# Patient Record
Sex: Female | Born: 1940 | Race: White | Hispanic: No | Marital: Married | Smoking: Never smoker
Health system: Southern US, Community
[De-identification: ages and names within clinical notes are randomized; demographics above are authoritative.]

## PROBLEM LIST (undated history)

## (undated) DIAGNOSIS — E785 Hyperlipidemia, unspecified: Secondary | ICD-10-CM

## (undated) DIAGNOSIS — M1990 Unspecified osteoarthritis, unspecified site: Secondary | ICD-10-CM

## (undated) DIAGNOSIS — E669 Obesity, unspecified: Secondary | ICD-10-CM

## (undated) DIAGNOSIS — Z955 Presence of coronary angioplasty implant and graft: Secondary | ICD-10-CM

## (undated) DIAGNOSIS — R05 Cough: Secondary | ICD-10-CM

## (undated) DIAGNOSIS — I2510 Atherosclerotic heart disease of native coronary artery without angina pectoris: Secondary | ICD-10-CM

## (undated) DIAGNOSIS — R053 Chronic cough: Secondary | ICD-10-CM

## (undated) DIAGNOSIS — I499 Cardiac arrhythmia, unspecified: Secondary | ICD-10-CM

## (undated) DIAGNOSIS — K219 Gastro-esophageal reflux disease without esophagitis: Secondary | ICD-10-CM

## (undated) DIAGNOSIS — I10 Essential (primary) hypertension: Secondary | ICD-10-CM

## (undated) HISTORY — DX: Hyperlipidemia, unspecified: E78.5

## (undated) HISTORY — DX: Unspecified osteoarthritis, unspecified site: M19.90

## (undated) HISTORY — PX: CHOLECYSTECTOMY: SHX55

## (undated) HISTORY — PX: APPENDECTOMY: SHX54

## (undated) HISTORY — DX: Obesity, unspecified: E66.9

## (undated) HISTORY — PX: BREAST ENHANCEMENT SURGERY: SHX7

## (undated) HISTORY — DX: Essential (primary) hypertension: I10

## (undated) HISTORY — DX: Atherosclerotic heart disease of native coronary artery without angina pectoris: I25.10

## (undated) HISTORY — DX: Cough: R05

## (undated) HISTORY — DX: Presence of coronary angioplasty implant and graft: Z95.5

## (undated) HISTORY — DX: Chronic cough: R05.3

## (undated) HISTORY — DX: Gastro-esophageal reflux disease without esophagitis: K21.9

## (undated) HISTORY — PX: ANGIOPLASTY: SHX39

## (undated) HISTORY — PX: TONSILLECTOMY: SUR1361

## (undated) HISTORY — PX: OTHER SURGICAL HISTORY: SHX169

---

## 1971-01-25 HISTORY — PX: VAGINAL HYSTERECTOMY: SUR661

## 1998-05-21 ENCOUNTER — Other Ambulatory Visit: Admission: RE | Admit: 1998-05-21 | Discharge: 1998-05-21 | Payer: Self-pay | Admitting: Obstetrics and Gynecology

## 1999-06-02 ENCOUNTER — Other Ambulatory Visit: Admission: RE | Admit: 1999-06-02 | Discharge: 1999-06-02 | Payer: Self-pay | Admitting: Obstetrics and Gynecology

## 2000-06-01 ENCOUNTER — Other Ambulatory Visit: Admission: RE | Admit: 2000-06-01 | Discharge: 2000-06-01 | Payer: Self-pay | Admitting: Obstetrics and Gynecology

## 2001-06-05 ENCOUNTER — Other Ambulatory Visit: Admission: RE | Admit: 2001-06-05 | Discharge: 2001-06-05 | Payer: Self-pay | Admitting: Obstetrics and Gynecology

## 2002-06-07 ENCOUNTER — Other Ambulatory Visit: Admission: RE | Admit: 2002-06-07 | Discharge: 2002-06-07 | Payer: Self-pay | Admitting: Obstetrics and Gynecology

## 2003-06-09 ENCOUNTER — Other Ambulatory Visit: Admission: RE | Admit: 2003-06-09 | Discharge: 2003-06-09 | Payer: Self-pay | Admitting: Obstetrics and Gynecology

## 2003-11-26 ENCOUNTER — Encounter: Admission: RE | Admit: 2003-11-26 | Discharge: 2003-11-26 | Payer: Self-pay | Admitting: Family Medicine

## 2004-05-04 ENCOUNTER — Encounter: Admission: RE | Admit: 2004-05-04 | Discharge: 2004-05-04 | Payer: Self-pay | Admitting: Neurology

## 2004-06-16 ENCOUNTER — Other Ambulatory Visit: Admission: RE | Admit: 2004-06-16 | Discharge: 2004-06-16 | Payer: Self-pay | Admitting: Obstetrics and Gynecology

## 2004-06-22 ENCOUNTER — Ambulatory Visit: Payer: Self-pay | Admitting: Internal Medicine

## 2004-07-22 ENCOUNTER — Ambulatory Visit: Payer: Self-pay | Admitting: Internal Medicine

## 2004-07-23 ENCOUNTER — Ambulatory Visit: Payer: Self-pay | Admitting: Cardiology

## 2004-08-19 ENCOUNTER — Ambulatory Visit: Payer: Self-pay | Admitting: Internal Medicine

## 2004-09-30 ENCOUNTER — Ambulatory Visit: Payer: Self-pay | Admitting: Internal Medicine

## 2005-06-17 ENCOUNTER — Other Ambulatory Visit: Admission: RE | Admit: 2005-06-17 | Discharge: 2005-06-17 | Payer: Self-pay | Admitting: Obstetrics and Gynecology

## 2006-06-21 ENCOUNTER — Other Ambulatory Visit: Admission: RE | Admit: 2006-06-21 | Discharge: 2006-06-21 | Payer: Self-pay | Admitting: Obstetrics and Gynecology

## 2006-07-04 ENCOUNTER — Inpatient Hospital Stay (HOSPITAL_BASED_OUTPATIENT_CLINIC_OR_DEPARTMENT_OTHER): Admission: RE | Admit: 2006-07-04 | Discharge: 2006-07-04 | Payer: Self-pay | Admitting: Cardiology

## 2006-07-17 ENCOUNTER — Inpatient Hospital Stay (HOSPITAL_COMMUNITY): Admission: AD | Admit: 2006-07-17 | Discharge: 2006-07-18 | Payer: Self-pay | Admitting: Cardiology

## 2006-07-17 HISTORY — PX: CARDIAC CATHETERIZATION: SHX172

## 2006-08-31 ENCOUNTER — Encounter (HOSPITAL_COMMUNITY): Admission: RE | Admit: 2006-08-31 | Discharge: 2006-11-29 | Payer: Self-pay | Admitting: Cardiology

## 2008-07-08 ENCOUNTER — Encounter: Payer: Self-pay | Admitting: Obstetrics and Gynecology

## 2008-07-08 ENCOUNTER — Ambulatory Visit: Payer: Self-pay | Admitting: Obstetrics and Gynecology

## 2008-07-08 ENCOUNTER — Other Ambulatory Visit: Admission: RE | Admit: 2008-07-08 | Discharge: 2008-07-08 | Payer: Self-pay | Admitting: Obstetrics and Gynecology

## 2008-07-15 ENCOUNTER — Ambulatory Visit: Payer: Self-pay | Admitting: Obstetrics and Gynecology

## 2008-10-29 ENCOUNTER — Encounter: Admission: RE | Admit: 2008-10-29 | Discharge: 2008-10-29 | Payer: Self-pay | Admitting: Orthopedic Surgery

## 2009-03-31 HISTORY — PX: CARDIOVASCULAR STRESS TEST: SHX262

## 2009-07-09 ENCOUNTER — Ambulatory Visit: Payer: Self-pay | Admitting: Obstetrics and Gynecology

## 2009-10-01 ENCOUNTER — Ambulatory Visit: Payer: Self-pay | Admitting: Cardiology

## 2009-10-06 ENCOUNTER — Encounter: Payer: Self-pay | Admitting: Cardiology

## 2010-02-16 ENCOUNTER — Encounter (INDEPENDENT_AMBULATORY_CARE_PROVIDER_SITE_OTHER): Payer: Self-pay | Admitting: *Deleted

## 2010-02-25 NOTE — Letter (Signed)
Summary: New Patient letter  [facility]  [address] [place], [place] [address]   Phone: [phone]  Fax: [phone]       02/16/2010 MRN: [id number]  [facility] [address] [place], [place]  [address]  Dear Ms. Heemstra,  Welcome to the Gastroenterology Division at [facility].    You are scheduled to see Dr.  Yancey Flemings on 03-24-2010 at 9:30am on the 3rd floor at [facility], [address]  We ask that you try to arrive at our office 15 minutes prior to your appointment time to allow for check-in.  We would like you to complete the enclosed self-administered evaluation form prior to your visit and bring it with you on the day of your appointment.  We will review it with you.  Also, please bring a complete list of all your medications or, if you prefer, bring the medication bottles and we will list them.  Please bring your insurance card so that we may make a copy of it.  If your insurance requires a referral to see a specialist, please bring your referral form from your primary care physician.  Co-payments are due at the time of your visit and may be paid by cash, check or credit card.     Your office visit will consist of a consult with your physician (includes a physical exam), any laboratory testing he/she may order, scheduling of any necessary diagnostic testing (e.g. x-ray, ultrasound, CT-scan), and scheduling of a procedure (e.g. Endoscopy, Colonoscopy) if required.  Please allow enough time on your schedule to allow for any/all of these possibilities.    If you cannot keep your appointment, please call [phone] to cancel or reschedule prior to your appointment date.  This allows [place] the opportunity to schedule an appointment for another patient in need of care.  If you do not cancel or reschedule by 5 p.m. the business day prior to your appointment date, you will be charged a $50.00 late cancellation/no-show fee.    Thank you  for choosing [facility] for your medical needs.  We appreciate the opportunity to care for you.  Please visit [place] at our website  to learn more about our practice.                     Sincerely,                                                             The Gastroenterology Division

## 2010-03-24 ENCOUNTER — Encounter: Payer: Self-pay | Admitting: Internal Medicine

## 2010-03-24 ENCOUNTER — Ambulatory Visit (INDEPENDENT_AMBULATORY_CARE_PROVIDER_SITE_OTHER): Payer: Medicare Other | Admitting: Internal Medicine

## 2010-03-24 DIAGNOSIS — Z1211 Encounter for screening for malignant neoplasm of colon: Secondary | ICD-10-CM

## 2010-03-24 DIAGNOSIS — E119 Type 2 diabetes mellitus without complications: Secondary | ICD-10-CM

## 2010-03-24 DIAGNOSIS — I2510 Atherosclerotic heart disease of native coronary artery without angina pectoris: Secondary | ICD-10-CM | POA: Insufficient documentation

## 2010-03-24 DIAGNOSIS — K219 Gastro-esophageal reflux disease without esophagitis: Secondary | ICD-10-CM | POA: Insufficient documentation

## 2010-04-01 NOTE — Letter (Signed)
Summary: [facility] Instructions  [facility]  [address] [place], [place] [address]   Phone: [phone]  Fax: [phone]       [name]    [birth year]    MRN: [id number]        Procedure Day /Date:TUESDAY 04/27/10     Arrival Time:9:00 AM     Procedure Time:10:00 AM     Location of Procedure:                    X  [facility] (4th Floor)         PREPARATION FOR COLONOSCOPY WITH MOVIPREP   Starting 5 days prior to your procedure 04/22/10 do not eat nuts, seeds, popcorn, corn, beans, peas,  salads, or any raw vegetables.  Do not take any fiber supplements (e.g. Metamucil, Citrucel, and Benefiber).  THE DAY BEFORE YOUR PROCEDURE         DATE: 04/26/10  DAY: MONDAY  1.  Drink clear liquids the entire day-NO SOLID FOOD  2.  Do not drink anything colored red or purple.  Avoid juices with pulp.  No orange juice.  3.  Drink at least 64 oz. (8 glasses) of fluid/clear liquids during the day to prevent dehydration and help the prep work efficiently.  CLEAR LIQUIDS INCLUDE: Water Jello Ice Popsicles Tea (sugar ok, no milk/cream) Powdered fruit flavored drinks Coffee (sugar ok, no milk/cream) Gatorade Juice: apple, white grape, white cranberry  Lemonade Clear bullion, consomm, broth Carbonated beverages (any kind) Strained chicken noodle soup Hard Candy                             4.  In the morning, mix first dose of MoviPrep solution:    Empty 1 Pouch A and 1 Pouch B into the disposable container    Add lukewarm drinking water to the top line of the container. Mix to dissolve    Refrigerate (mixed solution should be used within 24 hrs)  5.  Begin drinking the prep at 5:00 p.m. The MoviPrep container is divided by 4 marks.   Every 15 minutes drink the solution down to the next mark (approximately 8 oz) until the full liter is complete.   6.  Follow completed prep with 16 oz of clear liquid of your choice (Nothing red or purple).   Continue to drink clear liquids until bedtime.  7.  Before going to bed, mix second dose of MoviPrep solution:    Empty 1 Pouch A and 1 Pouch B into the disposable container    Add lukewarm drinking water to the top line of the container. Mix to dissolve    Refrigerate  THE DAY OF YOUR PROCEDURE      DATE: 04/27/10 DAY: TUESDAY  Beginning at 5:00 A.m. (5 hours before procedure):         1. Every 15 minutes, drink the solution down to the next mark (approx 8 oz) until the full liter is complete.  2. Follow completed prep with 16 oz. of clear liquid of your choice.    3. You may drink clear liquids until 8:00 AM (2 HOURS BEFORE PROCEDURE).   MEDICATION INSTRUCTIONS  Unless otherwise instructed, you should take regular prescription medications with a small sip of water   as early as possible the morning of your procedure.  Diabetic patients - see separate instructions.  Stop taking Plavix 04/22/10 (5 days before procedure).  A LETTER WILL BE SENT TO DR. Swaziland FOR OK TO HOLD PLAVIX         OTHER INSTRUCTIONS  You will need a responsible adult at least 70 years of age to accompany you and drive you home.   This person must remain in the waiting room during your procedure.  Wear loose fitting clothing that is easily removed.  Leave jewelry and other valuables at home.  However, you may wish to bring a book to read or  an iPod/MP3 player to listen to music as you wait for your procedure to start.  Remove all body piercing jewelry and leave at home.  Total time from sign-in until discharge is approximately 2-3 hours.  You should go home directly after your procedure and rest.  You can resume normal activities the  day after your procedure.  The day of your procedure you should not:   Drive   Make legal decisions   Operate machinery   Drink alcohol   Return to work  You will receive specific instructions about eating, activities and medications before you  leave.    The above instructions have been reviewed and explained to me by   _______________________    I fully understand and can verbalize these instructions _____________________________ Date _________

## 2010-04-01 NOTE — Letter (Signed)
Summary: Anticoagulation Modification Letter  [facility]  [address] [place], [place] [address]   Phone: [phone]  Fax: [phone]    March 24, 2010  Re:    [name] DOB:    [birth year] MRN:    [id number]    Dear Dr. Swaziland:  We have scheduled the above patient for a Colonoscopy procedure. Our records show that she is on anticoagulation therapy. Please advise as to how long the patient may come off their therapy of Plavix prior to the scheduled procedure(s) on 04/27/10.   Please fax back/or route the completed form to Milford Cage, CMA at [phone].  Thank you for your help with this matter.  Sincerely,    Wilhemina Bonito. Marina Goodell, MD  Physician Recommendation:  Hold Plavix 5 days prior ________________

## 2010-04-01 NOTE — Assessment & Plan Note (Signed)
Summary:  Consult - colonoscopy ( on Plavix, insulin)  and GERD   History of Present Illness Visit Type: Initial Visit Primary GI MD: Yancey Flemings MD Primary Provider: Sarita Haver Requesting Provider: n/a Chief Complaint: Consult for colonoscopy, pt on Plavix, Pt has never had a colonoscopy PCP recommended she get  one History of Present Illness:    70 year old female with hypertension, hyperlipidemia, coronary artery disease with prior coronary artery drug-eluting stent placement in 2007, osteoarthritis, and GERD. She is sent today regarding routine screening colonoscopy. She has not had this previously, but appears to have canceled a scheduled procedure appointment in 2005. In any event, GI review of systems is negative except for acid reflux. No lower abdominal complaints, change in bowel habits, or bleeding. Review of outside laboratories from December 2011 reveal normal CBC, comprehensive metabolic panel, and lipids. Hemoglobin A1c 7.0. No family history of colon cancer. In terms of GERD, she takes Prilosec 20 mg daily. Without medication, significant symptoms. With medication, good control of symptoms. No dysphagia. For her diabetes she takes both insulin and metformin. She is on aspirin Plavix. She sees Dr. Swaziland for her cardiac care. She tells me that she has been stable in this regard   GI Review of Systems    Reports acid reflux.      Denies abdominal pain, belching, bloating, chest pain, dysphagia with liquids, dysphagia with solids, heartburn, loss of appetite, nausea, vomiting, vomiting blood, weight loss, and  weight gain.        Denies anal fissure, black tarry stools, change in bowel habit, constipation, diarrhea, diverticulosis, fecal incontinence, heme positive stool, hemorrhoids, irritable bowel syndrome, jaundice, light color stool, liver problems, rectal bleeding, and  rectal pain. Preventive Screening-Counseling & Management  Alcohol-Tobacco     Smoking Status:  never      Drug Use:  no.      Current Medications (verified): 1)  Aspirin 325 Mg Tabs (Aspirin) .Marland Kitchen.. 1 By Mouth Once Daily 2)  Metformin Hcl 500 Mg Tabs (Metformin Hcl) .Mar[place] Kitchen.. 1 By Mouth Two Times A Day 3)  Lantus Solostar 100 Unit/ml Soln (Insulin Glargine) .... 60 Units Two Times A Day and Increase As Directed 4)  Ramipril 5 Mg Caps (Ramipril) .Mar[place] Kitchen.. 1 By Mouth Once Daily 5)  Plavix 75 Mg Tabs (Clopidogrel Bisulfate) .Mar[place] Kitchen.. 1 By Mouth Once Daily 6)  Bystolic 20 Mg Tabs (Nebivolol Hcl) .Mar[place] Kitchen.. 1 By Mouth Once Daily 7)  Crestor 10 Mg Tabs (Rosuvastatin Calcium) .Mar[place] Kitchen.. 1 By Mouth Once Daily 8)  Prilosec Otc 20 Mg Tbec (Omeprazole Magnesium) .Marland Kitchen.. 1 By Mouth Once Daily 9)  Climara 0.075 Mg/24hr Ptwk (Estradiol) .Mar[place] Kitchen.. 1 Patch Weekly 10)  Amoxicillin 500 Mg Caps (Amoxicillin) .Mar[place] Kitchen.. 1 By Mouth Three Times A Day For 7 Days Last Day Will Be Sunday 11)  Mucinex Maximum Strength 1200 Mg Xr12h-Tab (Guaifenesin) .Mar[place] Kitchen.. 1 By Mouth Two Times A Day 12)  Delsym 30 Mg/63ml Lqcr (Dextromethorphan Polistirex) .Marland Kitchen.. 10ml Every 12 Hours  Allergies (verified): 1)  ! Erythromycin  Past History:  Past Medical History: HTN GERD Hyperlipidemia Chronic Cough Depression OA Diabetes CAD  Past Surgical History: Appendectomy Cholecystectomy Angioplasty/Stent 2007  Family History: Reviewed history from 03/23/2010 and no changes required. 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  Daily Caffeine Use 2 per day Illicit Drug Use - no Smoking Status:  never Drug Use:  no  Review of Systems       The patient complains of back pain, cough, headaches-new, muscle pains/cramps, and night sweats.  The patient denies allergy/sinus, anemia, anxiety-new, arthritis/joint pain, blood in urine, breast changes/lumps, change in vision, confusion, coughing up  blood, depression-new, fainting, fatigue, fever, hearing problems, heart murmur, heart rhythm changes, itching, menstrual pain, nosebleeds, pregnancy symptoms, shortness of breath, skin rash, sleeping problems, sore throat, swelling of feet/legs, swollen lymph glands, thirst - excessive , urination - excessive , urination changes/pain, urine leakage, vision changes, and voice change.    Physical Exam  General:  Well developed, well nourished, no acute distress. Head:  Normocephalic and atraumatic. Eyes:  PERRLA, no icterus. Mouth:  No deformity or lesions, dentition normal. Neck:  Supple; no masses or thyromegaly. Lungs:  Clear throughout to auscultation. Heart:  Regular rate and rhythm; no murmurs, rubs,  or bruits. Abdomen:  Soft, nontender and nondistended. No masses, hepatosplenomegaly or hernias noted. Normal bowel sounds. Rectal:   deferred until colonoscopy Msk:  Symmetrical with no gross deformities. Normal posture. Pulses:  Normal pulses noted. Extremities:  No clubbing, cyanosis, edema or deformities noted. Neurologic:  Alert and  oriented x4;  grossly normal neurologically. Skin:  Intact without significant lesions or rashes. Psych:  Alert and cooperative. Normal mood and affect.   Impression & Recommendations:  Problem # 1:  SPECIAL SCREENING FOR MALIGNANT NEOPLASMS COLON (ICD-V76.51)  the patient is an appropriate candidate for screening colonoscopy without contraindication. However, she is high-risk given her comorbidities, the need to address Plavix therapy, and the need to adjust her diabetic medications. I discussed with her the nature of colonoscopy as well as its risks, benefits, and alternatives. She understood and agreed to proceed. We also discussed the issues surrounding Plavix therapy.   Plan : #1. Colonoscopy #2. Movi prep prescribed. The patient instructed on its use  #3. hold Plavix 5 days prior to the procedure , but continue on aspirin, if okay with Dr.  Swaziland. A letter has been sent to Dr. Swaziland for his review in this regard.   #4. One half evening  prior insulin and hold morning insulin and metformin the day of the procedure. As well, we will monitor her blood sugar immediately before and after the procedure..  Problem # 2:  CAD (ICD-414.00)  stable. Will address the Plavix issue with Dr. Swaziland. See above  Problem # 3:  DM (ICD-250.00)  adjustment of diabetic medications for the procedure. See above  Problem # 4:  GERD (ICD-530.81)  chronic GERD without alarm symptoms. However, requiring PPI therapy for control.  Plan : #1. Continue least dose of PPI therapy to control symptoms. #2. Reflux precautions  Other Orders: Colonoscopy (Colon)  Patient Instructions: 1)  Colon [facility] 04/27/10 10:00 am arrive at 9:00 am on 4th floor 2)  Movi prep instructions given. 3)  Movi prep prescription sent to your pharmacy,. 4)  Colonoscopy and Flexible Sigmoidoscopy brochure given.  5)  Hold Plavix x 5 days.  A letter will be sent to Dr. Swaziland for approval.   6)  1/2 Lantus evening before procedure.  Hold Lantus and oral diabetic meds morning of procedure. 7)  Copy sent to : Richard Tisovec,MD   Peter Swaziland, MD 8)  The medication list was reviewed and reconciled.  All changed / newly prescribed medications were explained.  A complete medication list was provided to the patient / caregiver. Prescriptions: MOVIPREP 100 GM  SOLR (PEG-KCL-NACL-NASULF-NA ASC-C) As per prep instructions.  #  1 x 0   Entered by:   Milford Cage NCMA   Authorized by:   Hilarie Fredrickson MD   Signed by:   Kathlee Barnhardt NCMA on 03/24/2010   Method used:   Electronically to        [id number] (retail)       [address]       [place], [place]  [address]       Ph: [phone] or [phone]       Fax: [phone]   RxID:   [id number]

## 2010-04-01 NOTE — Letter (Signed)
Summary: Diabetic Instructions  [facility]  [address] [place], [place] [address]   Phone: [phone]  Fax: [phone]    [name] 31-Aug-1940 MRN: [id number]   X   ORAL DIABETIC MEDICATION INSTRUCTIONS  The day before your procedure:   Take your diabetic pill as you do normally  The day of your procedure:   Do not take your diabetic pill    We will check your blood sugar levels during the admission process and again in Recovery before discharging you home  ________________________________________________________________________  X   INSULIN (LONG ACTING) MEDICATION INSTRUCTIONS (Lantus, NPH, 70/30, Humulin, Novolin-N)   The day before your procedure:   Take  your regular evening dose    The day of your procedure:   Do not take your morning dose

## 2010-04-27 ENCOUNTER — Other Ambulatory Visit: Payer: Medicare Other | Admitting: Internal Medicine

## 2010-05-03 ENCOUNTER — Telehealth: Payer: Self-pay | Admitting: Cardiology

## 2010-05-03 NOTE — Telephone Encounter (Signed)
FAX [phone] IS GOING TO HAVE EYE SURGERY AND THE OFFICE NEEDS CLEARANCE. SCHEDULED FOR 05/11/10

## 2010-05-03 NOTE — Telephone Encounter (Signed)
Received call from Dr. Ardyth Harps needing cardiac cl for eye surgery 4/17; advised Dr. Swaziland is on vac. Will be back in office 4/16

## 2010-05-05 ENCOUNTER — Encounter: Payer: Self-pay | Admitting: Internal Medicine

## 2010-05-06 ENCOUNTER — Encounter: Payer: Self-pay | Admitting: Internal Medicine

## 2010-05-06 ENCOUNTER — Ambulatory Visit (AMBULATORY_SURGERY_CENTER): Payer: Medicare Other | Admitting: Internal Medicine

## 2010-05-06 DIAGNOSIS — D126 Benign neoplasm of colon, unspecified: Secondary | ICD-10-CM

## 2010-05-06 DIAGNOSIS — Z1211 Encounter for screening for malignant neoplasm of colon: Secondary | ICD-10-CM

## 2010-05-06 DIAGNOSIS — K5730 Diverticulosis of large intestine without perforation or abscess without bleeding: Secondary | ICD-10-CM

## 2010-05-06 LAB — GLUCOSE, CAPILLARY
Glucose-Capillary: 126 mg/dL — ABNORMAL HIGH (ref 70–99)
Glucose-Capillary: 109 mg/dL — ABNORMAL HIGH (ref 70–99)

## 2010-05-06 MED ORDER — SODIUM CHLORIDE 0.9 % IV SOLN: 500.0000 mL | INTRAVENOUS | Status: AC

## 2010-05-06 NOTE — Patient Instructions (Addendum)
Patient given handout education for polyps, diverticulosis,  High fiber diet and hemorrhoids.  Per Dr. Marina Goodell to restart your plavix today.  To call with any questions or concerns.

## 2010-05-07 ENCOUNTER — Telehealth: Payer: Self-pay | Admitting: Cardiology

## 2010-05-07 ENCOUNTER — Telehealth: Payer: Self-pay | Admitting: *Deleted

## 2010-05-07 NOTE — Telephone Encounter (Signed)
Fax: [phone]  Last office vist, Stress test, EKG, Cath

## 2010-05-07 NOTE — Telephone Encounter (Signed)
Phone number is for an eye Doctor's office, and they don't open until 8:30am.   Patient does not list a home phone. Unable to leave a message.

## 2010-06-08 NOTE — Discharge Summary (Signed)
NAMEJOLIE, [name] NO.:  [id number]   MEDICAL RECORD NO.:  [id number]          PATIENT TYPE:  INP   LOCATION:  6524                         FACILITY:  [facility]   PHYSICIAN:  Peter M. Swaziland, M.D.  DATE OF BIRTH:  1940-10-24   DATE OF ADMISSION:  07/17/2006  DATE OF DISCHARGE:  07/18/2006                               DISCHARGE SUMMARY   HISTORY OF PRESENT ILLNESS:  [name] is a 70 year old white female  who presented with symptoms of atypical chest pain and dyspnea.  She had  newly-diagnosed diabetes mellitus type 2 and severe dyslipidemia.  She  subsequently underwent stress Cardiolite study which showed evidence of  anterior apical ischemia.  Cardiac catheterization demonstrated a  moderate stenosis of the proximal LAD, approximately 60-70%.  The  patient was admitted for stenting of this lesion.   For details of her past medical history, social history, family history  and physical exam, please see admission history and physical.   HOSPITAL COURSE:  The patient was brought to cardiac catheterization  laboratory.  She was loaded with Angiomax.  We initially performed an  intravascular ultrasound of the proximal LAD.  This demonstrated severe  stenosis in the region of interest, with a fairly focal lesion of  approximately 11 mm.  We then proceeded with stenting of this lesion  using a 3.0 x 16-mm Taxus stent that was postdilated to 3.25 mm based on  her IVUS reference diameter.  This yielded an excellent angiographic  result, with no significant residual stenosis, both by catheterization  and by intravascular ultrasound.  Her groin was sealed with an Angio-  Seal device.  She was monitored overnight.  She had no complications.  She had no groin hematoma.  Her CBC and BMET were stable.  Her ECG was  normal.  She was discharged home the following day in stable condition.   DISCHARGE DIAGNOSES:  1. Coronary artery disease status post stenting of the  proximal left      anterior descending.  2. Diabetes mellitus, type 2, insulin-requiring.  3. Hyperlipidemia.   DISCHARGE MEDICATIONS:  1. Enteric-coated aspirin 325 mg per day.  2. Plavix 75 mg per day.  3. Crestor 10 mg per day.  4. Altace 5 mg daily.  5. Wellbutrin XL 300 mg per day.  6. Metformin 500 mg twice a day to be resumed on Thursday, July 20, 2006.  7. Lantus insulin 20 units at bedtime.  8. Nitroglycerin 0.4 mg sublingual p.r.n.   FOLLOW UP:  The patient will follow up with Dr. Swaziland in mid-July.   CONDITION ON DISCHARGE:  Discharge status is improved.           ______________________________  Peter M. Swaziland, M.D.     PMJ/MEDQ  D:  07/18/2006  T:  07/18/2006  Job:  [id number]   cc:   Gaspar Garbe, M.D.

## 2010-06-08 NOTE — Cardiovascular Report (Signed)
NAMEBONNE, WHACK NO.:  [id number]   MEDICAL RECORD NO.:  [id number]          PATIENT TYPE:  INP   LOCATION:  6524                         FACILITY:  [facility]   PHYSICIAN:  Peter M. Swaziland, M.D.  DATE OF BIRTH:  1940-07-08   DATE OF PROCEDURE:  07/17/2006  DATE OF DISCHARGE:                            CARDIAC CATHETERIZATION   INDICATIONS FOR PROCEDURE:  The patient is a 70 year old white female  with newly diagnosed hyperlipidemia and diabetes mellitus.  She had  atypical chest pain.  A stress Cardiolite study demonstrated anterior  apical ischemic abnormality.  Subsequent diagnostic cardiac  catheterization demonstrated a 50-70% stenosis of the proximal LAD that  was eccentric.  There was a small caliber intermediate branch that was  subtotally occluded.  No other significant disease was noted.   PROCEDURE:  Intracoronary stenting of the proximal LAD with  intravascular ultrasound guidance.   ACCESS:  Via right femoral artery using the standard Seldinger  technique.   EQUIPMENT USED:  A 6-French arterial sheath, a 6-French FL-4 guide, a  0.014 Asahi soft wire, and Atlantis SR Pro IVUS catheter, a 3 x 12-mm  Maverick balloon, a 3 x 16-mm TAXUS stent, and a 3.25 x 12-mm Quantum  Maverick balloon.   CONTRAST:  Omnipaque 165 ml.   MEDICATIONS:  1. Versed 2 mg IV.  2. Nitroglycerin intracoronary 200 mcg x1.  3. Angiomax bolus 0.75 mg/kg, followed by continuous fusion at 1.75      mg/kg per hour.  4. Fentanyl 25 mcg IV.   The patient was hemodynamically stable throughout procedure and remained  pain free.  At the end of the procedure, her right groin was sealed with  an Angio-Seal device with excellent hemostasis.   PROCEDURE NOTE:  After initial guide shots were obtained, we proceeded  to anticoagulate the patient with Angiomax.  Next, we placed a wire down  the LAD.  This proved to be very difficult given the fact that the  patient's LAD was acutely  angulated from the left main coronary artery.  Also, the wire tended to select the intermediate branch.  After putting  in an exaggerated loop on the tip of the wire, we were finally able to  negotiate this using a Asahi soft wire.  The rest the procedure went  very smoothly.  Initially, we performed an intravascular ultrasound of the LAD, since  the lesion by angiogram did not appear to be that severe.  However, on  intravascular ultrasound there was indeed a focal severe stenosis in the  proximal LAD.  The distal reference lumen diameter was 3.1, with a  proximal reference lumen diameter of 3.3-mm.  The length of the lesion  was approximately 11-mm.  We initially pre-dilated the lesion using a 3  x 12-mm Maverick balloon up to 6 atmospheres.  We then exchanged for the  TAXUS stent and this was easily positioned and deployed in the proximal  LAD deploying this at 9 atmospheres and then 12 atmospheres with a stent  balloon.  We then exchanged for a 3.25 x 12-mm Quantum Maverick balloon  and inflated this in  the distal portion of the stent up to 9 atmospheres  in the proximal portion of the stent up to 14 atmospheres.  This yielded  an excellent angiographic result with 0% residual stenosis and TIMI  grade 3 flow.  We next followed up with intravascular ultrasound post stenting and this  demonstrated excellent stent apposition to the vessel wall with  excellent result both proximally and distally.   FINAL ASSESSMENT:  Successful intracoronary stenting of the proximal  left anterior descending artery using intravascular ultrasound guidance.           ______________________________  Peter M. Swaziland, M.D.     PMJ/MEDQ  D:  07/17/2006  T:  07/17/2006  Job:  [id number]   cc:   Gaspar Garbe, M.D.

## 2010-06-08 NOTE — Cardiovascular Report (Signed)
NAMEARALY, KAAS NO.:  [id number]   MEDICAL RECORD NO.:  [id number]          PATIENT TYPE:  OIB   LOCATION:  1961                         FACILITY:  [facility]   PHYSICIAN:  Peter M. Swaziland, M.D.  DATE OF BIRTH:  06/20/1940   DATE OF PROCEDURE:  07/04/2006  DATE OF DISCHARGE:                            CARDIAC CATHETERIZATION   INDICATIONS FOR PROCEDURE:  A 70 year old white female recently  diagnosed with diabetes, hypertension, hypercholesterolemia.  She was  experiencing symptoms of dyspnea on exertion and atypical chest pain.  She had a stress Cardiolite study which showed evidence of anterior  apical ischemia.   PROCEDURE:  Left heart catheterization, coronary left ventricular  angiography.   EQUIPMENT USED:  4-French 4 cm left Judkins catheter, 4-French 3-DRC  catheter, 4-French pigtail catheter, 4-French arterial sheath.   MEDICATIONS:  Local anesthesia with 1% Xylocaine, Versed 2 mg IV,  contrast 100 cc of Omnipaque.   HEMODYNAMIC DATA:  Aortic pressure was 115/59 with a mean of 83 mmHg.  Left ventricle pressure was 115 with an EDP of 13 mmHg.   ANGIOGRAPHIC DATA:  The left coronary arises and distributes normally.  The left main coronary artery is short and normal.   The left anterior descending artery has an eccentric 70% stenosis in the  proximal vessel.  The remainder of the vessels is without significant  disease.  The first diagonal branch has a high takeoff and is very small  in caliber.  It bifurcates early and the inferior limb of the diagonal  is subtotally occluded.  It is severely diffusely diseased and fills  faintly by collaterals.   The left circumflex coronary artery gives rise to 2 moderate-sized  marginal vessels, and these appear normal.   The right coronary artery has a shepherd's crook takeoff.  It is normal  throughout its course.   Left ventricular angiography was performed in the RAO view.  This  demonstrates normal  left ventricular size and contractility with normal  systolic function.  Ejection fraction is estimated at 65%.  There is no  mitral regurgitation or prolapse.   FINAL INTERPRETATION:  1. Single-vessel obstructive atherosclerotic coronary disease.  2. Normal left ventricular function.   PLAN:  The diagonal branch is very tiny in caliber and not suitable for  any intervention.  The LAD would be suitable for stenting.           ______________________________  Peter M. [place]il[place], M.D.     PMJ/MEDQ  D:  07/04/2006  T:  07/04/2006  Job:  [id number]   cc:   Gaspar Garbe, M.D.

## 2010-06-08 NOTE — H&P (Signed)
NAMECICILIA, [name] NO.:  [id number]   MEDICAL RECORD NO.:  [id number]           PATIENT TYPE:   LOCATION:                                 FACILITY:   PHYSICIAN:  Peter M. [place]iland, M.D.  DATE OF BIRTH:  November 16, 1940   DATE OF ADMISSION:  07/04/2006  DATE OF DISCHARGE:                              HISTORY & PHYSICAL   HISTORY OF PRESENT ILLNESS:  Ms. Marcon is a 70 year old white female  who was seen for new patient evaluation at the request of her husband.  She had a history of hypertension that was untreated.  She had been  experiencing significant cramps in her lower extremities at night, in  her feet and calves.  She complained of frequent headaches.  She has  also been experiencing pain in her right chest that feels like a catch  when she turns around or when she is in a certain position.  She has  complained of increased dyspnea on exertion for the past 6 months and is  experiencing 20-pound weight gain this year.  She has had no regular  medical follow-up.  The patient does report a evaluation a couple of  years ago by a pulmonary physician for persistent cough, and apparently  evaluation at that time was unremarkable.  She did check her blood sugar  on her husband's Glucometer and got a reading of 276.  On subsequent  evaluation, she was found to be significantly hypertensive.  She was  also diagnosed with diabetes that was uncontrolled.  She was started on  medical therapy with  Altace and metformin.  She was placed on Crestor  for hypercholesterolemia.  To further evaluate her symptoms of dyspnea,  she underwent a stress Cardiolite study on June 27, 2006.  She was able  to walk 9 minutes on the Bruce protocol without significant chest pain.  She did have occasional PVCs.  Her subsequent Cardiolite images  demonstrated evidence of anterior apical ischemia.  Ejection fraction  was normal at 75%.  Given these findings and her multiple risk factors,  it is  now recommended she undergo cardiac catheterization.   PAST MEDICAL HISTORY:  1. Gastroesophageal reflux disease.  2. Diabetes mellitus, newly diagnosed, untreated.  3. Hypertension.  4. Hypercholesterolemia.  5. History of cough.   PRIOR SURGERIES:  Include partial hysterectomy 1973, appendectomy 1988,  cholecystectomy 1998, and she has had previous breast implant surgery.   ALLERGIES:  SHE IS ALLERGIC TO ERYTHROMYCIN.   CURRENT MEDICATIONS:  1. Include Climara 0.075-mg patch daily.  2. Bupropion 300 mg per day.  3. Prilosec over-the-counter daily.  4. Metformin 500 mg b.i.d.  5. Altace 5 mg per day.  6. Crestor 10 mg per day.   SOCIAL HISTORY:  The patient is married.  She has two children.  She  works as a Diplomatic Services operational officer.  She denies alcohol or tobacco  use.   FAMILY HISTORY:  Father died age 27 from Parkinson's disease and lung  cancer.  Mother died at age 71 with alcoholic hepatitis.  She has no  siblings.   REVIEW OF SYSTEMS:  Is as noted in HPI, otherwise negative.   PHYSICAL EXAM:  GENERAL:  The patient is an obese white female in no  distress.  VITAL SIGNS:  Her weight is 219, blood pressure is one 170/90, pulse is  90 and regular.  HEENT:  She is normocephalic, atraumatic.  Pupils equal, round, reactive  to light and accommodation.  Extraocular movements are full.  Oropharynx  is clear.  NECK:  Without JVD, adenopathy, thyromegaly or bruits.  LUNGS:  Clear.  CARDIAC:  Exam reveals irregular rate and rhythm without gallop, murmur,  rub or click.  ABDOMEN:  Obese, soft, nontender.  She has no masses or bruits.  EXTREMITIES:  Without edema.  Pulses are 2+ and symmetric.  NEUROLOGIC:  Nonfocal.   LABORATORY DATA:  Chest x-ray shows no active disease.  ECG is normal.  Before initiating on medical therapy, her TSH was normal at 2.257.  Glucose is 327, BUN 14, creatinine 0.7, sodium 137, potassium 4.8,  chloride 98, CO2 27.  Liver function studies  were normal.  Cholesterol  is 246, triglycerides 300, HDL of 45, LDL of 140.  CBC was normal.  A1c  was 10.6%.   IMPRESSION:  1. Symptoms of dyspnea on exertion with abnormal Cardiolite study      suggesting anterior apical ischemia.  2. Diabetes mellitus, poorly controlled type 2.  3. Hypertension.  4. Hyperlipidemia combined  5. Obesity.  6. Status post cholecystectomy.  7. Status post partial hysterectomy.  8. Gastroesophageal reflux disease.   PLAN:  Will proceed with diagnostic cardiac catheterization.  The  patient is also scheduled to see Dr. Wylene Simmer for medical evaluation and  further modification of her risk factors.           ______________________________  Peter M. [place], M.D.     PMJ/MEDQ  D:  06/29/2006  T:  06/29/2006  Job:  [id number]   cc:   Gaspar Garbe, M.D.

## 2010-06-08 NOTE — H&P (Signed)
[name], Bean NO.:  [id number]   MEDICAL RECORD NO.:  [id number]          PATIENT TYPE:  OIB   LOCATION:  NA                           FACILITY:  [facility]   PHYSICIAN:  Peter M. [place]azil[place], M.D.  DATE OF BIRTH:  29-Sep-1940   DATE OF ADMISSION:  07/17/2006  DATE OF DISCHARGE:                              HISTORY & PHYSICAL   HISTORY OF PRESENT ILLNESS:  Ms. Bean is a 70 year old, white female  who is seen at the request of her husband for complaints of headaches  and pain in her right chest.  She had untreated hypertension and  diabetes at that time.  She also had untreated hypercholesterolemia.  She has been started on medical therapy for these issues.  She did  undergo a stress Cardiolite study on June 28, 2006, which demonstrated  evidence of an anterior apical ischemia.  Left ventricular function was  normal.  She subsequently underwent a diagnostic cardiac catheterization  on July 04, 2006.  This demonstrated 70% stenosis in the proximal LAD.  The first diagonal vessel was of very small caliber and the branch of  this diagonal was occluded and filled by collaterals.  Given her  symptoms and abnormal Cardiolite study, it was felt that she should  undergo percutaneous intervention of the LAD stenosis.  She is now  admitted for that therapy.  For details of her complete history of  present illness, past medical history, social history and family  history, please refer to H&P dictated on July 04, 2006.   PHYSICAL EXAMINATION:  VITAL SIGNS:  Weight 219, blood pressure is  160/90, pulse 90 and regular.  HEENT:  Unremarkable.  NECK:  She has no jugular venous distension or bruits.  LUNGS:  Lungs were clear.  CARDIAC:  Exam is without gallop, murmur, rub or click.  ABDOMEN:  Soft, nontender.  EXTREMITIES:  She has no edema.  Pulses are palpable.  NEUROLOGIC:  Nonfocal.   LABORATORY DATA:  Her prior ECG shows normal sinus rhythm and normal  ECG.  Prior chest  x-ray showed no active disease.   IMPRESSION:  1. Atypical chest pain with abnormal stress Cardiolite study showing      anterior apical ischemia.  Cardiac catheterization demonstrates      moderate proximal left anterior descending stenosis and severe      disease in a small diagonal branch.  2. Diabetes mellitus, type 2.  3. Hypertension.  4. Hypercholesterolemia.  5. Gastroesophageal reflux disease.   PLAN:  Proceed with percutaneous stenting of the proximal LAD.           ______________________________  Peter M. [place], M.D.     PMJ/MEDQ  D:  07/14/2006  T:  07/15/2006  Job:  [id number]   cc:   Gaspar Garbe, M.D.

## 2010-06-18 ENCOUNTER — Other Ambulatory Visit: Payer: Self-pay | Admitting: *Deleted

## 2010-06-18 MED ORDER — NEBIVOLOL HCL 20 MG PO TABS: 20.0000 mg | ORAL_TABLET | Freq: Every day | ORAL | Status: DC

## 2010-06-18 NOTE — Telephone Encounter (Signed)
Refill done.  

## 2010-07-12 ENCOUNTER — Encounter (INDEPENDENT_AMBULATORY_CARE_PROVIDER_SITE_OTHER): Payer: Medicare Other | Admitting: Obstetrics and Gynecology

## 2010-07-12 ENCOUNTER — Other Ambulatory Visit (HOSPITAL_COMMUNITY)
Admission: RE | Admit: 2010-07-12 | Discharge: 2010-07-12 | Disposition: A | Discharge status: Home / Self Care | Payer: Medicare Other | Source: Ambulatory Visit | Attending: Obstetrics and Gynecology | Admitting: Obstetrics and Gynecology

## 2010-07-12 ENCOUNTER — Other Ambulatory Visit: Payer: Self-pay | Admitting: Obstetrics and Gynecology

## 2010-07-12 DIAGNOSIS — N952 Postmenopausal atrophic vaginitis: Secondary | ICD-10-CM

## 2010-07-12 DIAGNOSIS — Z124 Encounter for screening for malignant neoplasm of cervix: Secondary | ICD-10-CM

## 2010-07-12 DIAGNOSIS — R82998 Other abnormal findings in urine: Secondary | ICD-10-CM

## 2010-07-12 DIAGNOSIS — N816 Rectocele: Secondary | ICD-10-CM

## 2010-07-12 DIAGNOSIS — N951 Menopausal and female climacteric states: Secondary | ICD-10-CM

## 2010-07-15 ENCOUNTER — Encounter: Payer: Self-pay | Admitting: Cardiology

## 2010-09-06 ENCOUNTER — Encounter: Payer: Self-pay | Admitting: Cardiology

## 2010-09-08 ENCOUNTER — Telehealth: Payer: Self-pay | Admitting: Cardiology

## 2010-09-08 NOTE — Telephone Encounter (Signed)
Faxed Latest OV, EKG,Stress Test, and Cath...no Echo available....faxed today

## 2010-09-08 NOTE — Telephone Encounter (Signed)
Call Back Phone#: [phone] Latest OV, EKG, ECHO, STRESS, CATH

## 2010-09-16 ENCOUNTER — Ambulatory Visit (INDEPENDENT_AMBULATORY_CARE_PROVIDER_SITE_OTHER): Payer: Medicare Other | Admitting: Cardiology

## 2010-09-16 ENCOUNTER — Encounter: Payer: Self-pay | Admitting: Cardiology

## 2010-09-16 DIAGNOSIS — E785 Hyperlipidemia, unspecified: Secondary | ICD-10-CM

## 2010-09-16 DIAGNOSIS — I2510 Atherosclerotic heart disease of native coronary artery without angina pectoris: Secondary | ICD-10-CM

## 2010-09-16 DIAGNOSIS — I10 Essential (primary) hypertension: Secondary | ICD-10-CM

## 2010-09-16 NOTE — Assessment & Plan Note (Signed)
Blood pressure control is acceptable. We will continue with Bystolic and Altace.

## 2010-09-16 NOTE — Progress Notes (Signed)
[name] Date of Birth: 25-Jun-1940   History of Present Illness: [name] is seen today for followup. She has been doing well from a cardiac standpoint. She denies any significant chest pain or shortness of breath. She is not exercising to any extent because of chronic problems with back and leg pain. She has a history of bursitis in her hips and arthritis in her spine. She did undergo right cataract surgery yesterday.  Current Outpatient Prescriptions on File Prior to Visit  Medication Sig Dispense Refill  . aspirin 325 MG tablet Take 325 mg by mouth daily.        . clopidogrel (PLAVIX) 75 MG tablet Take 75 mg by mouth daily.        . Difluprednate (DUREZOL) 0.05 % EMUL Apply to eye.        . estradiol (CLIMARA - DOSED IN MG/24 HR) 0.075 mg/24hr Place 1 patch onto the skin once a week.        . insulin glargine (LANTUS SOLOSTAR) 100 UNIT/ML injection Inject 60 Units into the skin 2 (two) times daily.        Marland Kitchen METFORMIN HCL PO Take 500 mg by mouth 2 (two) times daily.        Marland Kitchen moxifloxacin (VIGAMOX) 0.5 % ophthalmic solution Place 1 drop into the right eye 3 (three) times daily.        . Nebivolol HCl (BYSTOLIC) 20 MG TABS Take 1 tablet (20 mg total) by mouth daily.  30 tablet  5  . omeprazole (PRILOSEC) 20 MG capsule Take 20 mg by mouth daily.        . ramipril (ALTACE) 5 MG tablet Take 5 mg by mouth daily.        . rosuvastatin (CRESTOR) 10 MG tablet Take 10 mg by mouth daily.        Marland Kitchen amoxicillin (AMOXIL) 500 MG tablet Take 500 mg by mouth 3 (three) times daily.        Marland Kitchen dextromethorphan (DELSYM) 30 MG/5ML liquid Take 60 mg by mouth as needed.        Marland Kitchen Dextromethorphan-Guaifenesin (MUCINEX DM MAXIMUM STRENGTH) 60-1200 MG per 12 hr tablet Take 1 tablet by mouth every 12 (twelve) hours.         Current Facility-Administered Medications on File Prior to Visit  Medication Dose Route Frequency Provider Last Rate Last Dose  . 0.9 %  sodium chloride infusion  500 mL Intravenous  Continuous Yancey Flemings, MD        Allergies  Allergen Reactions  . Erythromycin     Past Medical History  Diagnosis Date  . Hypertension   . GERD (gastroesophageal reflux disease)   . Hyperlipidemia   . Depression   . Diabetes mellitus   . OA (osteoarthritis)   . CAD (coronary artery disease)   . Chronic cough   . Cataract   . History of heart artery stent   . DUB (dysfunctional uterine bleeding)   . Endometriosis     Past Surgical History  Procedure Date  . Appendectomy   . Cholecystectomy   . Angioplasty   . Heart stent   . Abdominal hysterectomy   . Vaginal hysterectomy 1973  . Tonsillectomy   . Cardiac catheterization 07/17/2006  . Cardiovascular stress test 03/31/2009    EF 72%    History  Smoking status  . Never Smoker   Smokeless tobacco  . Not on file    History  Alcohol Use No  Family History  Problem Relation Age of Onset  . Lung cancer Father   . Diabetes Maternal Grandmother   . Hypertension Maternal Grandmother     Review of Systems: As noted in history of present illness.  All other systems were reviewed and are negative.  Physical Exam: BP 130/82  Pulse 60  Ht 5\' 6"  (1.676 m)  Wt 221 lb 9.6 oz (100.517 kg)  BMI 35.77 kg/m2 The patient is alert and oriented x 3. She is morbidly obese. The mood and affect are normal.  The skin is warm and dry.  Color is normal.  The HEENT exam reveals that the sclera are nonicteric.  The mucous membranes are moist.  The carotids are 2+ without bruits.  There is no thyromegaly.  There is no JVD.  The lungs are clear.  The chest wall is non tender.  The heart exam reveals a regular rate with a normal S1 and S2.  There are no murmurs, gallops, or rubs.  The PMI is not displaced.   Abdominal exam reveals good bowel sounds.  There is no guarding or rebound.  There is no hepatosplenomegaly or tenderness.  There are no masses.  Exam of the legs reveal no clubbing, cyanosis, or edema.  The legs are without rashes.   The distal pulses are intact.  Cranial nerves II - XII are intact.  Motor and sensory functions are intact.  The gait is normal.  LABORATORY DATA:   Assessment / Plan:

## 2010-09-16 NOTE — Assessment & Plan Note (Signed)
She is asymptomatic from my anginal standpoint. We will continue with dual antiplatelet therapy but she may reduce her aspirin to 81 mg per day. I stressed the importance of regular aerobic exercise for cardiovascular fitness and to help her with weight loss. I suggested a water aerobics program.

## 2010-09-16 NOTE — Assessment & Plan Note (Signed)
Patient is concerned about recent report that her liver functions were elevated. We will obtain a copy of her lab work from Dr. Wylene Simmer. She is to continue Crestor until we have reviewed these results.

## 2010-09-16 NOTE — Patient Instructions (Signed)
Continue your current medications.  Consider water aerobics for exercise.  I will call and get a copy of your lab from Dr. Wylene Simmer.

## 2010-09-17 ENCOUNTER — Telehealth: Payer: Self-pay | Admitting: *Deleted

## 2010-09-17 NOTE — Telephone Encounter (Signed)
Advised that we received lab from Dr. Wylene Simmer and labs were good. Sl elevation in liver tests but will just watch for now. Continue same medications.

## 2010-10-11 ENCOUNTER — Other Ambulatory Visit: Payer: Self-pay | Admitting: *Deleted

## 2010-10-11 MED ORDER — CLOPIDOGREL BISULFATE 75 MG PO TABS: 75.0000 mg | ORAL_TABLET | Freq: Every day | ORAL | Status: DC

## 2010-10-11 NOTE — Telephone Encounter (Signed)
Refilled generic plavix to [facility]

## 2010-11-10 LAB — BASIC METABOLIC PANEL
BUN: 8
CO2: 25
CO2: 26
Calcium: 8.6
Chloride: 106
Chloride: 107
Creatinine, Ser: 0.64
Creatinine, Ser: 0.66
GFR calc Af Amer: >60
Glucose, Bld: 167 — ABNORMAL HIGH
Glucose, Bld: 181 — ABNORMAL HIGH
Potassium: 4

## 2010-11-10 LAB — CBC
HCT: 43.4
MCHC: 33.1
MCHC: 33.5
MCV: 87.5
MCV: 88.7
Platelets: 336
RBC: 4.26
RDW: 14.8 — ABNORMAL HIGH
RDW: 15.2 — ABNORMAL HIGH

## 2010-11-10 LAB — PROTIME-INR: Prothrombin Time: 13

## 2011-02-16 ENCOUNTER — Encounter: Payer: Self-pay | Admitting: Cardiology

## 2011-04-29 ENCOUNTER — Encounter: Payer: Self-pay | Admitting: Cardiology

## 2011-04-29 ENCOUNTER — Ambulatory Visit (INDEPENDENT_AMBULATORY_CARE_PROVIDER_SITE_OTHER): Payer: Medicare Other | Admitting: Cardiology

## 2011-04-29 VITALS — BP 116/80 | HR 71 | Ht 66.0 in | Wt 214.6 lb

## 2011-04-29 DIAGNOSIS — I2510 Atherosclerotic heart disease of native coronary artery without angina pectoris: Secondary | ICD-10-CM

## 2011-04-29 DIAGNOSIS — I10 Essential (primary) hypertension: Secondary | ICD-10-CM

## 2011-04-29 DIAGNOSIS — E785 Hyperlipidemia, unspecified: Secondary | ICD-10-CM

## 2011-04-29 NOTE — Assessment & Plan Note (Signed)
She has no significant anginal symptoms at this time. Her last stress test was in March of 2011 so I would anticipate repeating one in a year. I am very encouraged with her weight loss.

## 2011-04-29 NOTE — Assessment & Plan Note (Signed)
She is on chronic therapy with Crestor. I've requested a copy of her most recent lab work from Dr. Wylene Simmer.

## 2011-04-29 NOTE — Progress Notes (Signed)
[name] Date of Birth: [birth year]   History of Present Illness: [name] is seen today for followup. She has been doing well from a cardiac standpoint. She denies any significant chest pain or shortness of breath. She is not exercising to any extent because of chronic problems with back and leg pain. She does complain of occasional indigestion symptoms. She has been on a weight watchers program and reports a 15-16 pound weight loss.  Current Outpatient Prescriptions on File Prior to Visit  Medication Sig Dispense Refill  . aspirin 325 MG tablet Take 325 mg by mouth daily.        . clopidogrel (PLAVIX) 75 MG tablet Take 1 tablet (75 mg total) by mouth daily.  30 tablet  5  . estradiol (CLIMARA - DOSED IN MG/24 HR) 0.075 mg/24hr Place 1 patch onto the skin once a week.        . insulin glargine (LANTUS SOLOSTAR) 100 UNIT/ML injection Inject 50 Units into the skin 2 (two) times daily.       Marland Kitchen METFORMIN HCL PO Take 500 mg by mouth 2 (two) times daily.        . Nebivolol HCl (BYSTOLIC) 20 MG TABS Take 1 tablet (20 mg total) by mouth daily.  30 tablet  5  . omeprazole (PRILOSEC) 20 MG capsule Take 20 mg by mouth daily.        . ramipril (ALTACE) 5 MG tablet Take 5 mg by mouth daily.        . rosuvastatin (CRESTOR) 10 MG tablet Take 10 mg by mouth daily.         Current Facility-Administered Medications on File Prior to Visit  Medication Dose Route Frequency Provider Last Rate Last Dose  . 0.9 %  sodium chloride infusion  500 mL Intravenous Continuous Hilarie Fredrickson, MD        Allergies  Allergen Reactions  . Erythromycin     Past Medical History  Diagnosis Date  . Hypertension   . GERD (gastroesophageal reflux disease)   . Hyperlipidemia   . Depression   . Diabetes mellitus   . OA (osteoarthritis)   . CAD (coronary artery disease)   . Chronic cough   . Cataract   . History of heart artery stent   . DUB (dysfunctional uterine bleeding)   . Endometriosis   . Obesity      Past Surgical History  Procedure Date  . Appendectomy   . Cholecystectomy   . Angioplasty   . Heart stent   . Abdominal hysterectomy   . Vaginal hysterectomy 1973  . Tonsillectomy   . Cardiac catheterization 07/17/2006  . Cardiovascular stress test 03/31/2009    EF 72%    History  Smoking status  . Never Smoker   Smokeless tobacco  . Not on file    History  Alcohol Use No    Family History  Problem Relation Age of Onset  . Lung cancer Father   . Diabetes Maternal Grandmother   . Hypertension Maternal Grandmother     Review of Systems: As noted in history of present illness.  All other systems were reviewed and are negative.  Physical Exam: BP 116/80  Pulse 71  Ht 5\' 6"  (1.676 m)  Wt 214 lb 9.6 oz (97.342 kg)  BMI 34.64 kg/m2 The patient is alert and oriented x 3. She is morbidly obese. The mood and affect are normal.  The skin is warm and dry.  Color is normal.  The HEENT exam reveals that the sclera are nonicteric.  The mucous membranes are moist.  The carotids are 2+ without bruits.  There is no thyromegaly.  There is no JVD.  The lungs are clear.  The chest wall is non tender.  The heart exam reveals a regular rate with a normal S1 and S2.  There are no murmurs, gallops, or rubs.  The PMI is not displaced.   Abdominal exam reveals good bowel sounds.  There is no guarding or rebound.  There is no hepatosplenomegaly or tenderness.  There are no masses.  Exam of the legs reveal no clubbing, cyanosis, or edema.  The legs are without rashes.  The distal pulses are intact.  Cranial nerves II - XII are intact.  Motor and sensory functions are intact.  The gait is normal.  LABORATORY DATA: ECG today demonstrates normal sinus rhythm with left axis deviation and an incomplete right bundle branch block.  Assessment / Plan:

## 2011-04-29 NOTE — Patient Instructions (Signed)
Continue your current medication.  Continue your efforts at weight loss  I will get a copy of your lab work from Dr. Wylene Simmer  I will see you again in 6 months.

## 2011-04-29 NOTE — Assessment & Plan Note (Signed)
Her blood pressure is adequately controlled on ACE inhibitor and beta blocker therapy.

## 2011-05-02 ENCOUNTER — Other Ambulatory Visit: Payer: Self-pay | Admitting: *Deleted

## 2011-05-02 MED ORDER — NEBIVOLOL HCL 20 MG PO TABS: 20.0000 mg | ORAL_TABLET | Freq: Every day | ORAL | Status: DC

## 2011-06-28 ENCOUNTER — Encounter: Payer: Self-pay | Admitting: Obstetrics and Gynecology

## 2011-07-08 ENCOUNTER — Telehealth: Payer: Self-pay | Admitting: Cardiology

## 2011-07-08 NOTE — Telephone Encounter (Signed)
Called [facility]/ informed we received MS[facility] and I will forward request to DR Swaziland / nurse and she will receive word Monday. Asked her to call back with further questions/ number provided for return call.

## 2011-07-08 NOTE — Telephone Encounter (Signed)
OK to hold Plavix for epidural 5 days. Stefany Starace [place] MD, [facility]

## 2011-07-08 NOTE — Telephone Encounter (Signed)
Please return call to patient at [phone] regarding plavix hold for upcoming epidural.

## 2011-07-11 NOTE — Telephone Encounter (Signed)
Patient called was told ok with Dr.Jordan to hold plavix for epidural 5 days.

## 2011-07-16 ENCOUNTER — Other Ambulatory Visit: Payer: Self-pay | Admitting: Cardiology

## 2011-07-22 ENCOUNTER — Encounter: Payer: Self-pay | Admitting: Obstetrics and Gynecology

## 2011-07-22 ENCOUNTER — Ambulatory Visit (INDEPENDENT_AMBULATORY_CARE_PROVIDER_SITE_OTHER): Payer: Medicare Other | Admitting: Obstetrics and Gynecology

## 2011-07-22 VITALS — BP 126/80 | Ht 66.0 in | Wt 204.0 lb

## 2011-07-22 DIAGNOSIS — N952 Postmenopausal atrophic vaginitis: Secondary | ICD-10-CM

## 2011-07-22 DIAGNOSIS — N816 Rectocele: Secondary | ICD-10-CM

## 2011-07-22 DIAGNOSIS — Z780 Asymptomatic menopausal state: Secondary | ICD-10-CM

## 2011-07-22 MED ORDER — ESTRADIOL 0.1 MG/24HR TD PTWK: 1.0000 | MEDICATED_PATCH | TRANSDERMAL | Status: DC

## 2011-07-22 NOTE — Progress Notes (Signed)
Patient came back to see me today for further followup. She is on Climara 0.075 mg weekly. She is still having some hot flashes. She is up-to-date on mammograms. She has had a normal bone density in 2009. She is having no vaginal bleeding. She is having no pelvic pain. She has always had normal Pap smears since her hysterectomy which was in 1973. She is losing weight and her diabetes is getting easier to control. She is having trouble with bulging lumbar discs and just have a steroid injection. She has a rectocele but is asymptomatic without noticing a bulge or difficulty eliminating stool. She had her last Pap smear in 2012. She is having no vaginal dryness or dyspareunia on her estrogen patch.  ROS: 12 system review done. Pertinent positives above. Other positives include CAD, hypertension, and hyperlipidemia.  HEENT: Within normal limits. Kennon Portela present. Neck: No masses. Supraclavicular lymph nodes: Not enlarged. Breasts: Examined in both sitting and lying position. Symmetrical without skin changes or masses. Abdomen: Soft no masses guarding or rebound. No hernias. Pelvic: External within normal limits. BUS within normal limits. Vaginal examination shows fair  estrogen effect, no cystocele enterocele. First degree rectocele. Cervix and uterus absent. Adnexa within normal limits. Rectovaginal confirmatory. Extremities within normal limits.  Assessment: #1. Menopausal symptoms #2. A symptomatic rectocele  Plan: We increased her Climara patch to .1 mg weekly. In reviewing her chart after she left it appears that we have tried that a year ago but she's currently on 0.075. She will continue yearly mammograms. She will try stopping her estrogen after the summer but reinitiated if she feels it's helping. We will watch her rectocele.The new Pap smear guidelines were discussed with the patient. No packs done.

## 2011-10-28 ENCOUNTER — Ambulatory Visit (INDEPENDENT_AMBULATORY_CARE_PROVIDER_SITE_OTHER): Payer: Medicare Other | Admitting: Cardiology

## 2011-10-28 ENCOUNTER — Encounter: Payer: Self-pay | Admitting: Cardiology

## 2011-10-28 VITALS — BP 124/86 | HR 61 | Ht 66.0 in | Wt 212.4 lb

## 2011-10-28 DIAGNOSIS — I10 Essential (primary) hypertension: Secondary | ICD-10-CM

## 2011-10-28 DIAGNOSIS — I2510 Atherosclerotic heart disease of native coronary artery without angina pectoris: Secondary | ICD-10-CM

## 2011-10-28 DIAGNOSIS — E119 Type 2 diabetes mellitus without complications: Secondary | ICD-10-CM

## 2011-10-28 DIAGNOSIS — E785 Hyperlipidemia, unspecified: Secondary | ICD-10-CM

## 2011-10-28 NOTE — Progress Notes (Signed)
Rachael Fee Date of Birth: [birth year]   History of Present Illness: [name] is seen today for followup. She has a history of coronary disease with prior stenting of the LAD in 2008. This was with a 3.0 x 16 mm Taxus stent. She has been doing well from a cardiac standpoint. She denies any significant chest pain or shortness of breath. She really is getting no exercise. She has been trying to watch her diet and has lost 2 pounds. She reports that her insulin requirements have decreased from 100 units twice daily to 44 units twice daily. She has had some recent back and knee problems and has had some physical therapy for this.  Current Outpatient Prescriptions on File Prior to Visit  Medication Sig Dispense Refill  . aspirin 325 MG tablet Take 325 mg by mouth daily.        . clopidogrel (PLAVIX) 75 MG tablet take 1 tablet by mouth once daily  30 tablet  5  . estradiol (CLIMARA) 0.1 mg/24hr Place 1 patch (0.1 mg total) onto the skin once a week.  4 patch  12  . insulin glargine (LANTUS SOLOSTAR) 100 UNIT/ML injection Inject 50 Units into the skin 2 (two) times daily.       Marland Kitchen METFORMIN HCL PO Take 500 mg by mouth 2 (two) times daily.        . Nebivolol HCl (BYSTOLIC) 20 MG TABS Take 1 tablet (20 mg total) by mouth daily.  30 tablet  5  . omeprazole (PRILOSEC) 20 MG capsule Take 20 mg by mouth daily.        . ramipril (ALTACE) 5 MG tablet Take 5 mg by mouth daily.        . rosuvastatin (CRESTOR) 10 MG tablet Take 10 mg by mouth daily.        Marland Kitchen UNKNOWN TO PATIENT Patient is participating in a drug study for cholesterol...Marland KitchenMarland KitchenUnknown name of drug      . DISCONTD: estradiol (CLIMARA - DOSED IN MG/24 HR) 0.075 mg/24hr Place 1 patch onto the skin once a week.         Current Facility-Administered Medications on File Prior to Visit  Medication Dose Route Frequency Provider Last Rate Last Dose  . 0.9 %  sodium chloride infusion  500 mL Intravenous Continuous Hilarie Fredrickson, MD        Allergies    Allergen Reactions  . Erythromycin     Past Medical History  Diagnosis Date  . Hypertension   . GERD (gastroesophageal reflux disease)   . Hyperlipidemia   . Depression   . Diabetes mellitus   . OA (osteoarthritis)   . CAD (coronary artery disease)   . Chronic cough   . Cataract   . History of heart artery stent   . DUB (dysfunctional uterine bleeding)   . Endometriosis   . Obesity     Past Surgical History  Procedure Date  . Appendectomy   . Cholecystectomy   . Angioplasty   . Heart stent   . Tonsillectomy   . Cardiac catheterization 07/17/2006  . Cardiovascular stress test 03/31/2009    EF 72%  . Vaginal hysterectomy 1973    History  Smoking status  . Never Smoker   Smokeless tobacco  . Not on file    History  Alcohol Use  . Yes    Rare    Family History  Problem Relation Age of Onset  . Lung cancer Father   . Diabetes Maternal  Grandmother   . Hypertension Maternal Grandmother   . Alcohol abuse Mother     Review of Systems: As noted in history of present illness.  All other systems were reviewed and are negative.  Physical Exam: BP 124/86  Pulse 61  Ht 5\' 6"  (1.676 m)  Wt 212 lb 6.4 oz (96.344 kg)  BMI 34.28 kg/m2  SpO2 95% The patient is alert and oriented x 3. She is morbidly obese. The mood and affect are normal.  The skin is warm and dry.  Color is normal.  The HEENT exam reveals that the sclera are nonicteric.  The mucous membranes are moist.  The carotids are 2+ without bruits.  There is no thyromegaly.  There is no JVD.  The lungs are clear.  The chest wall is non tender.  The heart exam reveals a regular rate with a normal S1 and S2.  There are no murmurs, gallops, or rubs.  The PMI is not displaced.   Abdominal exam reveals good bowel sounds.  There is no guarding or rebound.  There is no hepatosplenomegaly or tenderness.  There are no masses.  Exam of the legs reveal no clubbing, cyanosis, or edema.  The legs are without rashes.  The distal  pulses are intact.  Cranial nerves II - XII are intact.  Motor and sensory functions are intact.  The gait is normal.  LABORATORY DATA:   Assessment / Plan: 1. Coronary disease status post stenting of the proximal LAD in 2008 with a drug-eluting stent. Her last stress test was in March of 2011. She remains asymptomatic. I've encouraged her to increase her aerobic activity and continue with weight loss. We will continue with dual antiplatelet therapy and beta blocker therapy.  2. Hypertension, well controlled.  3. Hyperlipidemia. Continue Crestor. She is currently enrolled in our lipid trial looking at a new CTEP Inhibitor.  4. Insulin-dependent diabetes mellitus.

## 2011-10-28 NOTE — Patient Instructions (Signed)
Continue your current therapy.   Try and get some aerobic exercise.  I will see you again in 6 months.

## 2011-12-07 ENCOUNTER — Other Ambulatory Visit: Payer: Self-pay

## 2011-12-07 MED ORDER — NEBIVOLOL HCL 20 MG PO TABS: 20.0000 mg | ORAL_TABLET | Freq: Every day | ORAL | Status: DC

## 2012-03-01 ENCOUNTER — Telehealth: Payer: Self-pay | Admitting: *Deleted

## 2012-03-01 NOTE — Telephone Encounter (Signed)
Insurance company faxed over tier exception form for estradiol 0.1 mg patch. Form filled out and faxed to [facility].

## 2012-03-06 NOTE — Telephone Encounter (Signed)
(  late entry )[employer] called and left message that estradiol 0.1 mg patch approved. Pharmacy informed as well

## 2012-04-12 ENCOUNTER — Encounter: Payer: Self-pay | Admitting: Gynecology

## 2012-04-12 ENCOUNTER — Ambulatory Visit (INDEPENDENT_AMBULATORY_CARE_PROVIDER_SITE_OTHER): Payer: Medicare Other | Admitting: Gynecology

## 2012-04-12 NOTE — Patient Instructions (Addendum)
Follow up for ultrasound as scheduled 

## 2012-04-12 NOTE — Progress Notes (Signed)
Former patient of Dr. Eda Paschal presents complaining of right-sided lower quadrant pain started several days ago aching to sharp stabbing. Saw her primary physician who ultimately ordered a CT scan and was told that she should see her gynecologist. Patient notes her pain is actually getting better. She has no nausea vomiting diarrhea constipation or urinary symptoms. Had a urine workup with blood work at her primary's office all of which was negative.  Is status post vaginal hysterectomy for bleeding on ERT for hot flushes. Status post appendectomy and cholecystectomy in the past. 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  Exam with [name] assistant Abdomen obese, soft nontender without masses guarding rebound or organomegaly. Active bowel sounds throughout. Pelvic external BUS vagina with mild atrophic changes. Mild cystocele. Cuff well supported. Second-degree rectocele. Bimanual without masses or tenderness. Rectovaginal exam confirms rectocele otherwise unremarkable.  Assessment and plan: Right lower quadrant pain resolving. She did bring the disc of the CT scan but unfortunately no report. I am unable to see any large pathology but I feel an adequate to interpret. I suspect that they probably saw her left hydrosalpinx and referred her for that reason. We'll go ahead and followup with GYN ultrasound to she will schedule that now. I'll get a written report from her CT scan to review and then we'll put everything together and go from there.  As her pain is getting better I do not think anything further needs to be done at this time from that standpoint.

## 2012-04-27 ENCOUNTER — Ambulatory Visit: Payer: Medicare Other | Admitting: Gynecology

## 2012-04-27 ENCOUNTER — Other Ambulatory Visit: Payer: Medicare Other

## 2012-05-02 ENCOUNTER — Ambulatory Visit (INDEPENDENT_AMBULATORY_CARE_PROVIDER_SITE_OTHER): Payer: Medicare Other

## 2012-05-02 ENCOUNTER — Encounter: Payer: Self-pay | Admitting: Gynecology

## 2012-05-02 ENCOUNTER — Ambulatory Visit (INDEPENDENT_AMBULATORY_CARE_PROVIDER_SITE_OTHER): Payer: Medicare Other | Admitting: Gynecology

## 2012-05-02 ENCOUNTER — Other Ambulatory Visit: Payer: Self-pay | Admitting: Gynecology

## 2012-05-02 DIAGNOSIS — N7011 Chronic salpingitis: Secondary | ICD-10-CM

## 2012-05-02 DIAGNOSIS — N83339 Acquired atrophy of ovary and fallopian tube, unspecified side: Secondary | ICD-10-CM

## 2012-05-02 DIAGNOSIS — R1031 Right lower quadrant pain: Secondary | ICD-10-CM

## 2012-05-02 DIAGNOSIS — N7013 Chronic salpingitis and oophoritis: Secondary | ICD-10-CM

## 2012-05-02 DIAGNOSIS — R1900 Intra-abdominal and pelvic swelling, mass and lump, unspecified site: Secondary | ICD-10-CM

## 2012-05-02 DIAGNOSIS — N8300 Follicular cyst of ovary, unspecified side: Secondary | ICD-10-CM

## 2012-05-02 NOTE — Progress Notes (Signed)
Patient follows up for ultrasound. Had episode of right lower quadrant pain with CT scan showing a cystic area in her pelvis. Review of her chart reveals a history of hydrosalpinx with ultrasound 2005 noting a tortuous tubular echo-free cystic structure measuring 53 x 24 x 23 mm. Note on report said unchanged for 5 years. Consistent with left hydrosalpinx.  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 Negative cul-de-sac.   Assessment and plan: Reviewed with patient. 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 Does appear to have slightly enlarged since 2005. Patient's pain has resolved. Reviewed options to include surgery now such as laparoscopic assessment and removal versus observation. As she's not having pain and this has been present for a number of years with minimal change I feel comfortable with observation.  I did review the disclaimer cannot guarantee this is not a slow growing malignant process but unlikely. After lengthy discussion of risks of surgery versus benefits patient's comfortable with observation and will plan repeat in 3-6 months just sure this is remaining stable and she is comfortable with this. She is due for her annual in June and have asked her to make that appointment also.

## 2012-05-02 NOTE — Patient Instructions (Signed)
Follow up for your annual exam in June/July. We will plan on repeating your ultrasound in 3-6 months to follow this cystic area.

## 2012-05-25 ENCOUNTER — Encounter (INDEPENDENT_AMBULATORY_CARE_PROVIDER_SITE_OTHER): Payer: Medicare Other

## 2012-05-25 DIAGNOSIS — R0989 Other specified symptoms and signs involving the circulatory and respiratory systems: Secondary | ICD-10-CM

## 2012-06-14 ENCOUNTER — Ambulatory Visit: Payer: Medicare Other | Admitting: Cardiology

## 2012-06-26 ENCOUNTER — Other Ambulatory Visit: Payer: Self-pay | Admitting: Cardiology

## 2012-06-26 ENCOUNTER — Other Ambulatory Visit: Payer: Self-pay

## 2012-06-26 MED ORDER — NEBIVOLOL HCL 20 MG PO TABS: 20.0000 mg | ORAL_TABLET | Freq: Every day | ORAL | Status: DC

## 2012-06-26 NOTE — Telephone Encounter (Signed)
Nebivolol HCl (BYSTOLIC) 20 MG TABS  Take 1 tablet (20 mg total) by mouth daily.   30 tablet   Patient Instructions  Continue your current therapy.  Try and get some aerobic exercise. I will see you again in 6 months. Chart Reviewed By  Charna Elizabeth, LPN  on 47/08/2954 11:02 AM

## 2012-08-10 ENCOUNTER — Ambulatory Visit (INDEPENDENT_AMBULATORY_CARE_PROVIDER_SITE_OTHER): Payer: Medicare Other | Admitting: Gynecology

## 2012-08-10 ENCOUNTER — Encounter: Payer: Self-pay | Admitting: Gynecology

## 2012-08-10 ENCOUNTER — Encounter: Payer: Self-pay | Admitting: Obstetrics and Gynecology

## 2012-08-10 VITALS — BP 124/74 | Ht 66.0 in | Wt 208.0 lb

## 2012-08-10 DIAGNOSIS — N7011 Chronic salpingitis: Secondary | ICD-10-CM

## 2012-08-10 DIAGNOSIS — N816 Rectocele: Secondary | ICD-10-CM

## 2012-08-10 DIAGNOSIS — N952 Postmenopausal atrophic vaginitis: Secondary | ICD-10-CM

## 2012-08-10 DIAGNOSIS — N7013 Chronic salpingitis and oophoritis: Secondary | ICD-10-CM

## 2012-08-10 DIAGNOSIS — Z780 Asymptomatic menopausal state: Secondary | ICD-10-CM

## 2012-08-10 DIAGNOSIS — Z79890 Hormone replacement therapy (postmenopausal): Secondary | ICD-10-CM

## 2012-08-10 MED ORDER — ESTRADIOL 0.075 MG/24HR TD PTWK: 1.0000 | MEDICATED_PATCH | TRANSDERMAL | Status: DC

## 2012-08-10 NOTE — Patient Instructions (Addendum)
Followup for your bone density as scheduled. Followup for your ultrasound as scheduled. Call to Schedule your mammogram  Facilities in [place]: 1)  [facility] [place], [place] Phone: [phone] 2)  [facility], [phone] Phone: [phone] 3)  Dr. Yolanda Bonine at [facility] [address] Phone: [phone]     Mammogram A mammogram is an X-ray test to find changes in a woman's breast. You should get a mammogram if:  You are 72 years of age or older  You have risk factors.   Your doctor recommends that you have one.  BEFORE THE TEST  Do not schedule the test the week before your period, especially if your breasts are sore during this time.  On the day of your mammogram:  Wash your breasts and armpits well. After washing, do not put on any deodorant or talcum powder on until after your test.   Eat and drink as you usually do.   Take your medicines as usual.   If you are diabetic and take insulin, make sure you:   Eat before coming for your test.   Take your insulin as usual.   If you cannot keep your appointment, call before the appointment to cancel. Schedule another appointment.  TEST  You will need to undress from the waist up. You will put on a hospital gown.   Your breast will be put on the mammogram machine, and it will press firmly on your breast with a piece of plastic called a compression paddle. This will make your breast flatter so that the machine can X-ray all parts of your breast.   Both breasts will be X-rayed. Each breast will be X-rayed from above and from the side. An X-ray might need to be taken again if the picture is not good enough.   The mammogram will last about 15 to 30 minutes.  AFTER THE TEST Finding out the results of your test Ask when your test results will be ready. Make sure you get your test results.  Document Released:  04/08/2008 Document Revised: 12/30/2010 Document Reviewed: 04/08/2008 [facility] Patient Information 2012 [place], [place].

## 2012-08-10 NOTE — Progress Notes (Signed)
[name] July 21, 1940 [id number]        72 y.o.  G2P2002 for followup exam.  Several issues noted below.  Past medical history,surgical history, medications, allergies, family history and social history were all reviewed and documented in the EPIC chart.  ROS:  Performed and pertinent positives and negatives are included in the history, assessment and plan .  Exam: [name] assistant Filed Vitals:   08/10/12 0911  BP: 124/74  Height: 5\' 6"  (1.676 m)  Weight: 208 lb (94.348 kg)   General appearance  Normal Skin numerous seborrheic keratoses covering her entire body. Head/Neck normal with no cervical or supraclavicular adenopathy thyroid normal Lungs  clear Cardiac RR, without RMG Abdominal  soft, nontender, without masses, organomegaly or hernia Breasts  examined lying and sitting without masses, retractions, discharge or axillary adenopathy. Pelvic  Ext/BUS/vagina  normal with atrophic changes. First degree rectocele noted  Adnexa  Without masses or tenderness    Anus and perineum  normal   Rectovaginal  normal sphincter tone without palpated masses or tenderness. First degree rectocele noted.   Assessment/Plan:  72 y.o. [id number] female for followup exam.   1. Hydrosalpinx.  History of hydrosalpinx with ultrasound 2005 noting a tortuous tubular echo-free cystic structure measuring 53 x 24 x 23 mm. Note on report said unchanged for 5 years. Consistent with left hydrosalpinx.  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 Negative cul-de-sac. Per 05/02/2012 discussion we planned expectant management with repeat ultrasound in 3-6 months. We'll go ahead and schedule ultrasound now and relook at this area. 2. ERT. Patient on Climara 0.1 mg patches. Had been increased by Dr. Eda Paschal do 2 night sweats.  I reviewed the whole issue of HRT with her to include the WHI study with increased risk of stroke, heart attack, DVT and  breast cancer. The ACOG and NAMS statements for lowest dose for the shortest period of time reviewed. Transdermal versus oral first-pass effect benefit discussed.  The issue of using estrogen into the 70s discussed. Newer information as to whether this confers cardioprotective benefit with long-term estrogen use also discussed. After a lengthy discussion we'll plan on slowly decreasing her dose and weaning her off. We'll start with 0.075 mg patches one month's supply, refill x3. She does well with this and will go to the .05 mg. 3. Rectocele. Patient has a mild rectocele asymptomatic to her. We'll continue to monitor. 4. DEXA 5 years ago normal. Plan repeat now and patient will schedule. Increase calcium and vitamin D reviewed. 5. Mammography 05/2011. Patient knows she needs to schedule now and agrees to do so. SBE monthly reviewed. 6. Colonoscopy 2012. Followup are recommended interval. 7. Pap smear 06/2010. No Pap smear done today. No history of significant abnormal Pap smears. History of hysterectomy for benign indication. Over the age of 72. Reviewed current screening guidelines and we both agree to stop screening at this time. 8. Health maintenance. No blood work done today as it is all done through her primary physician's office. Followup for ultrasound and DEXA. Otherwise followup in 1 year.  Note: This document was prepared with digital dictation and possible smart phrase technology. Any transcriptional errors that result from this process are unintentional.   Dara Lords MD, 9:45 AM 08/10/2012

## 2012-08-11 LAB — URINALYSIS W MICROSCOPIC + REFLEX CULTURE
Bilirubin Urine: NEGATIVE
Glucose, UA: NEGATIVE mg/dL
Hgb urine dipstick: NEGATIVE
Leukocytes, UA: NEGATIVE
Protein, ur: NEGATIVE mg/dL

## 2012-08-20 ENCOUNTER — Ambulatory Visit: Payer: Medicare Other | Admitting: Cardiology

## 2012-08-24 ENCOUNTER — Ambulatory Visit: Payer: Medicare Other | Admitting: Nurse Practitioner

## 2012-09-14 ENCOUNTER — Encounter: Payer: Self-pay | Admitting: Nurse Practitioner

## 2012-09-14 ENCOUNTER — Ambulatory Visit (INDEPENDENT_AMBULATORY_CARE_PROVIDER_SITE_OTHER): Payer: Medicare Other | Admitting: Nurse Practitioner

## 2012-09-14 VITALS — BP 160/80 | HR 68 | Ht 66.0 in | Wt 218.4 lb

## 2012-09-14 DIAGNOSIS — I2510 Atherosclerotic heart disease of native coronary artery without angina pectoris: Secondary | ICD-10-CM

## 2012-09-14 NOTE — Progress Notes (Signed)
[name] Date of Birth: 05-Feb-1940 Medical Record #[id number]  History of Present Illness: [name] is seen back today for a recall visit. Seen for [name]. Has known CAD with past Taxus stent to the LAD in 2008. Other issues include HTN, IDDM, GERD and HLD.   Has not been seen since October of 2013.   Comes back today. Here alone. Doing well. Not active. Not really motivated to exercise. No chest pain but has never had chest pain. A1C running higher. Weight is up. BP usually ok - on some NSAID for her OA from ortho. Does not check her BP routinely at home. Not short of breath. Not dizzy/passing out. No palpitations. Tolerating her medicines. Most of her labs are checked by [name] but she is in a study for her lipids.    Current Outpatient Prescriptions  Medication Sig Dispense Refill  . aspirin 81 MG tablet Take 81 mg by mouth daily.      . clopidogrel (PLAVIX) 75 MG tablet take 1 tablet by mouth once daily  30 tablet  5  . estradiol (CLIMARA - DOSED IN MG/24 HR) 0.075 mg/24hr Place 1 patch (0.075 mg total) onto the skin once a week.  4 patch  3  . HYDROcodone-acetaminophen (NORCO/VICODIN) 5-325 MG per tablet Take 1 tablet by mouth as needed.       . insulin glargine (LANTUS SOLOSTAR) 100 UNIT/ML injection Inject 56 Units into the skin 2 (two) times daily.       . meloxicam (MOBIC) 15 MG tablet Take 15 mg by mouth daily.       Janet Kitchen METFORMIN HCL PO Take 500 mg by mouth 2 (two) times daily.        . Nebivolol HCl (BYSTOLIC) 20 MG TABS Take 1 tablet (20 mg total) by mouth daily.  30 tablet  3  . omeprazole (PRILOSEC) 20 MG capsule Take 20 mg by mouth daily.        . ramipril (ALTACE) 5 MG tablet Take 5 mg by mouth daily.        . rosuvastatin (CRESTOR) 10 MG tablet Take 10 mg by mouth daily.        [name] Bean is participating in a drug study for cholesterol...Janet KitchenMarland KitchenUnknown name of drug      . sertraline (ZOLOFT) 50 MG tablet Take 50 mg by mouth daily.         Current Facility-Administered Medications  Medication Dose Route Frequency Provider Last Rate Last Dose  . 0.9 %  sodium chloride infusion  500 mL Intravenous Continuous Hilarie Fredrickson, MD        Allergies  Allergen Reactions  . Erythromycin Other (See Comments)    Mouth ulcers    Past Medical History  Diagnosis Date  . Hypertension   . GERD (gastroesophageal reflux disease)   . Hyperlipidemia   . Depression   . Diabetes mellitus   . OA (osteoarthritis)   . CAD (coronary artery disease)   . Chronic cough   . Cataract   . History of heart artery stent   . DUB (dysfunctional uterine bleeding)   . Endometriosis   . Obesity     Past Surgical History  Procedure Laterality Date  . Appendectomy    . Cholecystectomy    . Angioplasty    . Heart stent    . Tonsillectomy    . Cardiac catheterization  07/17/2006  . Cardiovascular stress test  03/31/2009    EF  72%  . Vaginal hysterectomy  1973    History  Smoking status  . Never Smoker   Smokeless tobacco  . Not on file    History  Alcohol Use  . Yes    Comment: Rare    Family History  Problem Relation Age of Onset  . Lung cancer Father   . Diabetes Maternal Grandmother   . Hypertension Maternal Grandmother   . Alcohol abuse Mother     Review of Systems: The review of systems is per the HPI.  All other systems were reviewed and are negative.  Physical Exam: BP 160/80  Pulse 68  Ht 5\' 6"  (1.676 m)  Wt 218 lb 6.4 oz (99.066 kg)  BMI 35.27 kg/m2 BP by me is down to 130/80.  Bean is very pleasant and in no acute distress. She is obese. Weight is up 6 pounds. Skin is warm and dry. Color is normal.  HEENT is unremarkable. Normocephalic/atraumatic. PERRL. Sclera are nonicteric. Neck is supple. No masses. No JVD. Lungs are clear. Cardiac exam shows a regular rate and rhythm. Abdomen is soft. Extremities are without edema. Gait and ROM are intact. No gross neurologic deficits noted.  LABORATORY DATA:  Lab  Results  Component Value Date   WBC 5.6 07/18/2006   HGB 12.5 07/18/2006   HCT 37.3 07/18/2006   PLT 280 07/18/2006   GLUCOSE 181* 07/18/2006   NA 139 07/18/2006   K 4.0 07/18/2006   CL 107 07/18/2006   CREATININE 0.66 07/18/2006   BUN 7 07/18/2006   CO2 26 07/18/2006   INR 1.0 07/17/2006     Assessment / Plan: 1. CAD - past stenting of the LAD - last stress test looks to be back in 2011 - no symptoms at the time of her PCI - multiple risk factors and unfortunately, not really motivated to make changes. Will update her Myoview. Tentatively see her back in one year.   2. HTN - BP better by me - needs to monitor at home.   3. HLD - in a study  4. IDDM - sounds uncontrolled at the present time.   Tentatively see her back in a year.   Bean is agreeable to this plan and will call if any problems develop in the interim.   Rosalio Macadamia, RN, ANP-C [facility] [address] [place], [place]  [address]

## 2012-09-14 NOTE — Patient Instructions (Addendum)
We will arrange for a stress test (Lexiscan)  See Dr. Swaziland in one year  Monitor your blood pressure at home and keep a record  Call the [facility] Care office at [phone] if you have any questions, problems or concerns.

## 2012-09-27 ENCOUNTER — Encounter: Payer: Self-pay | Admitting: Cardiology

## 2012-09-27 ENCOUNTER — Ambulatory Visit (HOSPITAL_COMMUNITY): Payer: Medicare Other | Attending: Nurse Practitioner | Admitting: Radiology

## 2012-09-27 VITALS — BP 99/50 | HR 58 | Ht 66.0 in | Wt 220.0 lb

## 2012-09-27 DIAGNOSIS — I10 Essential (primary) hypertension: Secondary | ICD-10-CM | POA: Insufficient documentation

## 2012-09-27 DIAGNOSIS — Z9861 Coronary angioplasty status: Secondary | ICD-10-CM | POA: Insufficient documentation

## 2012-09-27 DIAGNOSIS — R0989 Other specified symptoms and signs involving the circulatory and respiratory systems: Secondary | ICD-10-CM | POA: Insufficient documentation

## 2012-09-27 DIAGNOSIS — R0609 Other forms of dyspnea: Secondary | ICD-10-CM | POA: Insufficient documentation

## 2012-09-27 DIAGNOSIS — E785 Hyperlipidemia, unspecified: Secondary | ICD-10-CM | POA: Insufficient documentation

## 2012-09-27 DIAGNOSIS — R0602 Shortness of breath: Secondary | ICD-10-CM

## 2012-09-27 DIAGNOSIS — I2510 Atherosclerotic heart disease of native coronary artery without angina pectoris: Secondary | ICD-10-CM

## 2012-09-27 DIAGNOSIS — E119 Type 2 diabetes mellitus without complications: Secondary | ICD-10-CM | POA: Insufficient documentation

## 2012-09-27 MED ORDER — REGADENOSON 0.4 MG/5ML IV SOLN
0.4000 mg | Freq: Once | INTRAVENOUS | Status: AC
  Administered 2012-09-27: 0.4 mg via INTRAVENOUS

## 2012-09-27 MED ORDER — TECHNETIUM TC 99M SESTAMIBI GENERIC - CARDIOLITE
11.0000 | Freq: Once | INTRAVENOUS | Status: AC | PRN
  Administered 2012-09-27: 11 via INTRAVENOUS

## 2012-09-27 MED ORDER — TECHNETIUM TC 99M SESTAMIBI GENERIC - CARDIOLITE
33.0000 | Freq: Once | INTRAVENOUS | Status: AC | PRN
  Administered 2012-09-27: 33 via INTRAVENOUS

## 2012-09-27 NOTE — Progress Notes (Signed)
[facility] SITE 3 NUCLEAR MED [address] [place], [place] [address] [phone]    Cardiology Nuclear Med Study  [name] is a 72 y.o. female     MRN : [id number]     DOB: 22-Mar-1940  Procedure Date: 09/27/2012  Nuclear Med Background Indication for Stress Test:  Evaluation for Ischemia and PTCA/Stent Patency History:  07-17-06 Heart Catheterization> Stent LAD; 03-31-09 Myocardial Perfusion Study, Normal, no ischemia,EF=72% Cardiac Risk Factors: Hypertension, IDDM Type 2 and Lipids  Symptoms:  DOE   Nuclear Pre-Procedure Caffeine/Decaff Intake:  None NPO After: 7:00pm   Lungs:  clear O2 Sat: 96% on room air. IV 0.9% NS with Angio Cath:  22g  IV Site: R Hand  IV Started by:  Bonnita Levan, RN  Chest Size (in):  38 Cup Size: DD (Implants)  Height: 5\' 6"  (1.676 m)  Weight:  220 lb (99.791 kg)  BMI:  Body mass index is 35.53 kg/(m^2). Tech Comments:  BS @ 7am =122    Nuclear Med Study 1 or 2 day study: 1 day  Stress Test Type:  Lexiscan  Reading MD: Marca Ancona, MD  Order Authorizing Provider:  Peter Swaziland, MD, and Norma Fredrickson, NP  Resting Radionuclide: Technetium 68m Sestamibi  Resting Radionuclide Dose: 10.9 mCi   Stress Radionuclide:  Technetium 60m Sestamibi  Stress Radionuclide Dose: 33.0 mCi           Stress Protocol Rest HR: 58 Stress HR: 95  Rest BP: 99/50 Stress BP: 140/61  Exercise Time (min): 2:00 METS: n/a   Predicted Max HR: 148 bpm % Max HR: 64.19 bpm Rate Pressure Product: 21308   Dose of Adenosine (mg):  n/a Dose of Lexiscan: 0.4 mg  Dose of Atropine (mg): n/a Dose of Dobutamine: n/a mcg/kg/min (at max HR)  Stress Test Technologist: Irean Hong, RN  Nuclear Technologist:  Domenic Polite, CNMT     Rest Procedure:  Myocardial perfusion imaging was performed at rest 45 minutes following the intravenous administration of Technetium 72m Sestamibi. Rest ECG: NSR - Normal EKG  Stress Procedure:  The patient received IV  Lexiscan 0.4 mg over 15-seconds with concurrent low level exercise and then Technetium 40m Sestamibi was injected at 30-seconds while the patient continued walking one more minute.  The patient complained of DOE, Chest Pain, and headache with Lexiscan. Quantitative spect images were obtained after a 45-minute delay. Stress ECG: No significant change from baseline ECG  QPS Raw Data Images:  Normal; no motion artifact; normal heart/lung ratio. Stress Images:  There is a small area of severe reversible defect in the mid and apical anterior wall.  Rest Images:  Normal homogeneous uptake in all areas of the myocardium. Subtraction (SDS):  These findings are consistent with ischemia. Transient Ischemic Dilatation (Normal <1.22):  n/a Lung/Heart Ratio (Normal <0.45):  0.37  Quantitative Gated Spect Images QGS EDV:  95 ml QGS ESV:  40 ml  Impression Exercise Capacity:  Lexiscan with low level exercise. BP Response:  Normal blood pressure response. Clinical Symptoms:  Typical chest pain. ECG Impression:  No significant ST segment change suggestive of ischemia. Comparison with Prior Nuclear Study:   Overall Impression:  Intermediate risk stress nuclear study There is a small size, severe reversible defect in the mid and distal anterior wall (2 segments) consistent with ischemia in the distal LAD territory.  LV Ejection Fraction: 58%.  LV Wall Motion:  NL LV Function; NL Wall Motion

## 2012-10-01 ENCOUNTER — Telehealth: Payer: Self-pay | Admitting: Cardiology

## 2012-10-01 NOTE — Telephone Encounter (Signed)
Returned call to patient she stated she has appointment with Norma Fredrickson NP 10/18/12 for a visit to schedule cardiac cath.Stated she wanted to know if ok to go to [facility] next week.Spoke to Norma Fredrickson NP and she advised do not go to [facility].Patient requested appointment next week to see Lawson Fiscal, will check with [facility] and call her back.

## 2012-10-01 NOTE — Telephone Encounter (Signed)
New problem     Receive a call from [facility] regarding test results that was abnormal

## 2012-10-02 ENCOUNTER — Telehealth: Payer: Self-pay | Admitting: *Deleted

## 2012-10-02 NOTE — Telephone Encounter (Signed)
Danielle called patient, appointment scheduled with Norma Fredrickson NP 10/08/12.

## 2012-10-02 NOTE — Telephone Encounter (Signed)
S/w pt aware of new appointment on 9/15 @ 11:45

## 2012-10-08 ENCOUNTER — Ambulatory Visit
Admission: RE | Admit: 2012-10-08 | Discharge: 2012-10-08 | Disposition: A | Discharge status: Home / Self Care | Payer: Medicare Other | Source: Ambulatory Visit | Attending: Nurse Practitioner | Admitting: Nurse Practitioner

## 2012-10-08 ENCOUNTER — Encounter: Payer: Self-pay | Admitting: Nurse Practitioner

## 2012-10-08 ENCOUNTER — Ambulatory Visit (INDEPENDENT_AMBULATORY_CARE_PROVIDER_SITE_OTHER): Payer: Medicare Other | Admitting: Nurse Practitioner

## 2012-10-08 ENCOUNTER — Other Ambulatory Visit: Payer: Self-pay | Admitting: Nurse Practitioner

## 2012-10-08 ENCOUNTER — Telehealth: Payer: Self-pay | Admitting: *Deleted

## 2012-10-08 VITALS — BP 120/80 | HR 72 | Ht 66.0 in | Wt 217.1 lb

## 2012-10-08 DIAGNOSIS — I2510 Atherosclerotic heart disease of native coronary artery without angina pectoris: Secondary | ICD-10-CM

## 2012-10-08 DIAGNOSIS — R9439 Abnormal result of other cardiovascular function study: Secondary | ICD-10-CM

## 2012-10-08 DIAGNOSIS — Z01810 Encounter for preprocedural cardiovascular examination: Secondary | ICD-10-CM

## 2012-10-08 LAB — CBC WITH DIFFERENTIAL/PLATELET
Basophils Absolute: 0 10*3/uL (ref 0.0–0.1)
Basophils Relative: 0.3 % (ref 0.0–3.0)
Eosinophils Absolute: 0.2 10*3/uL (ref 0.0–0.7)
Eosinophils Relative: 1.8 % (ref 0.0–5.0)
HCT: 44.4 % (ref 36.0–46.0)
Hemoglobin: 14.9 g/dL (ref 12.0–15.0)
Lymphocytes Relative: 27.2 % (ref 12.0–46.0)
Lymphs Abs: 2.7 10*3/uL (ref 0.7–4.0)
MCHC: 33.6 g/dL (ref 30.0–36.0)
MCV: 88.4 fl (ref 78.0–100.0)
Monocytes Absolute: 0.5 10*3/uL (ref 0.1–1.0)
Monocytes Relative: 5.3 % (ref 3.0–12.0)
Neutro Abs: 6.5 10*3/uL (ref 1.4–7.7)
Neutrophils Relative %: 65.4 % (ref 43.0–77.0)
Platelets: 304 10*3/uL (ref 150.0–400.0)
RBC: 5.02 Mil/uL (ref 3.87–5.11)
RDW: 15.4 % — ABNORMAL HIGH (ref 11.5–14.6)
WBC: 9.9 10*3/uL (ref 4.5–10.5)

## 2012-10-08 LAB — BASIC METABOLIC PANEL
BUN: 15 mg/dL (ref 6–23)
CO2: 27 mEq/L (ref 19–32)
Calcium: 9.2 mg/dL (ref 8.4–10.5)
Chloride: 106 mEq/L (ref 96–112)
Creatinine, Ser: 0.7 mg/dL (ref 0.4–1.2)
GFR: 91.91 mL/min (ref >60.00)
Glucose, Bld: 119 mg/dL — ABNORMAL HIGH (ref 70–99)
Potassium: 4.3 mEq/L (ref 3.5–5.1)
Sodium: 138 mEq/L (ref 135–145)

## 2012-10-08 LAB — PROTIME-INR
INR: 1.1 ratio — ABNORMAL HIGH (ref 0.8–1.0)
Prothrombin Time: 11.2 s (ref 10.2–12.4)

## 2012-10-08 LAB — APTT: aPTT: 28.5 s (ref 21.7–28.8)

## 2012-10-08 NOTE — Progress Notes (Signed)
[name] Date of Birth: 30-Apr-1940 Medical Record #[id number]  History of Present Illness: [name] is seen back today for a pre cath visit. Seen for Dr. Swaziland. She has known CAD with past Taxus stent to the LAD in 2008, HTN, HLD, IDDM and GERD.  Most recently seen back in August - no real symptoms but apparently has never had cardiac symptoms prior to her PCI. We updated her Myoview - this turned out to be abnormal.  Comes back today. Here with her husband. Continues to do well. No symptoms reported. Continues to be fatigued and will have some DOE but no chest pain. Tolerating her medicines.   Current Outpatient Prescriptions  Medication Sig Dispense Refill  . aspirin 81 MG tablet Take 81 mg by mouth daily.      . clopidogrel (PLAVIX) 75 MG tablet take 1 tablet by mouth once daily  30 tablet  5  . estradiol (CLIMARA - DOSED IN MG/24 HR) 0.075 mg/24hr Place 1 patch (0.075 mg total) onto the skin once a week.  4 patch  3  . HYDROcodone-acetaminophen (NORCO/VICODIN) 5-325 MG per tablet Take 1 tablet by mouth as needed.       . insulin glargine (LANTUS SOLOSTAR) 100 UNIT/ML injection Inject 56 Units into the skin 2 (two) times daily.       Marland Kitchen METFORMIN HCL PO Take 500 mg by mouth 2 (two) times daily.        . Nebivolol HCl (BYSTOLIC) 20 MG TABS Take 1 tablet (20 mg total) by mouth daily.  30 tablet  3  . omeprazole (PRILOSEC) 20 MG capsule Take 20 mg by mouth daily.        . ramipril (ALTACE) 5 MG tablet Take 5 mg by mouth daily.        . rosuvastatin (CRESTOR) 10 MG tablet Take 10 mg by mouth daily.        Marland Kitchen UNKNOWN TO PATIENT Patient is participating in a drug study for cholesterol...Marland KitchenMarland KitchenUnknown name of drug       Current Facility-Administered Medications  Medication Dose Route Frequency Provider Last Rate Last Dose  . 0.9 %  sodium chloride infusion  500 mL Intravenous Continuous Hilarie Fredrickson, MD        Allergies  Allergen Reactions  . Erythromycin Other (See Comments)    Mouth ulcers    Past Medical History  Diagnosis Date  . Hypertension   . GERD (gastroesophageal reflux disease)   . Hyperlipidemia   . Depression   . Diabetes mellitus   . OA (osteoarthritis)   . CAD (coronary artery disease)   . Chronic cough   . Cataract   . History of heart artery stent   . DUB (dysfunctional uterine bleeding)   . Endometriosis   . Obesity     Past Surgical History  Procedure Laterality Date  . Appendectomy    . Cholecystectomy    . Angioplasty    . Heart stent    . Tonsillectomy    . Cardiac catheterization  07/17/2006  . Cardiovascular stress test  03/31/2009    EF 72%  . Vaginal hysterectomy  1973    History  Smoking status  . Never Smoker   Smokeless tobacco  . Not on file    History  Alcohol Use  . Yes    Comment: Rare    Family History  Problem Relation Age of Onset  . Lung cancer Father   . Diabetes Maternal Grandmother   .  Hypertension Maternal Grandmother   . Alcohol abuse Mother     Review of Systems: The review of systems is per the HPI.  All other systems were reviewed and are negative.  Physical Exam: Ht 5\' 6"  (1.676 m)  Wt 217 lb 1.9 oz (98.485 kg)  BMI 35.06 kg/m2 Patient is very pleasant and in no acute distress. She is obese. Skin is warm and dry. Color is normal.  HEENT is unremarkable. Normocephalic/atraumatic. PERRL. Sclera are nonicteric. Neck is supple. No masses. No JVD. Lungs are clear. Cardiac exam shows a regular rate and rhythm. Abdomen is soft. Extremities are without edema. Gait and ROM are intact. No gross neurologic deficits noted.  LABORATORY DATA: PENDING  Lab Results  Component Value Date   WBC 5.6 07/18/2006   HGB 12.5 07/18/2006   HCT 37.3 07/18/2006   PLT 280 07/18/2006   GLUCOSE 181* 07/18/2006   NA 139 07/18/2006   K 4.0 07/18/2006   CL 107 07/18/2006   CREATININE 0.66 07/18/2006   BUN 7 07/18/2006   CO2 26 07/18/2006   INR 1.0 07/17/2006   Myoview Impression  Exercise Capacity: Lexiscan with  low level exercise.  BP Response: Normal blood pressure response.  Clinical Symptoms: Typical chest pain.  ECG Impression: No significant ST segment change suggestive of ischemia.  Comparison with Prior Nuclear Study:   Overall Impression: Intermediate risk stress nuclear study There is a small size, severe reversible defect in the mid and distal anterior wall (2 segments) consistent with ischemia in the distal LAD territory.   LV Ejection Fraction: 58%. LV Wall Motion: NL LV Function; NL Wall Motion    Assessment / Plan: 1. Known CAD with past stenting of the LAD - now with abnormal Myoview - remains asymptomatic and has never had cardiac symptoms in the past - will need repeat cath to further define - the procedure has been discussed in full detail and she is willing to proceed. Case scheduled for tomorrow with Dr. Swaziland at [facility].   2. HTN - BP looks good.   3. HLD - in a study for her lipids.   4. IDDM - managed by PCP - will need to do half dose tonight and none in the am in light of her upcoming procedure.   Further disposition to follow.   Patient is agreeable to this plan and will call if any problems develop in the interim.   Rosalio Macadamia, RN, ANP-C [facility] [address] [place], [place]  [address]

## 2012-10-08 NOTE — Patient Instructions (Addendum)
We need to arrange for a heart catheterization with Dr. Swaziland for tomorrow  You are scheduled for a cardiac catheterization on Tuesday, September 16th with Dr. Swaziland or associate.  Go to [facility] 2nd Floor Short Stay on Tuesday, September 16th at 9:30 am for an 11:30 am case.  Enter thru the [employer] entrance A No food or drink after midnight tonight.  Take only half dose of your insulin tonight and none tomorrow. You may take your other medications with a sip of water on the day of your procedure.   We need to check labs today  When you leave here today, go to [facility] at the [facility] to the first floor - you can walk in for your chest Xray.   Call the [facility] office at [phone] if you have any questions, problems or concerns.    Coronary Angiography Coronary angiography is an X-ray procedure used to look at the arteries in the heart. In this procedure, a dye is injected through a long, hollow tube (catheter). The catheter is about the size of a piece of cooked spaghetti. The catheter injects a dye into an artery in your groin. X-rays are then taken to show if there is a blockage in the arteries of your heart. BEFORE THE PROCEDURE   Let your caregiver know if you have allergies to shellfish or contrast dye. Also let your caregiver know if you have kidney problems or failure.  Do not eat or drink starting from midnight up to the time of the procedure, or as directed.  You may drink enough water to take your medications the morning of the procedure if you were instructed to do so.  You should be at the hospital or outpatient facility where the procedure is to be done 60 minutes prior to the procedure or as directed. PROCEDURE  You may be given an IV medication to help you relax before the procedure.  You will be prepared for the procedure by washing and shaving the area where the catheter will be inserted. This is  usually done in the groin but may be done in the fold of your arm by your elbow.  A medicine will be given to numb your groin where the catheter will be inserted.  A specially trained doctor will insert the catheter into an artery in your groin. The catheter is guided by using a special type of X-ray (fluoroscopy) to the blood vessel being examined.  A special dye is then injected into the catheter and X-rays are taken. The dye helps to show where any narrowing or blockages are located in the heart arteries. AFTER THE PROCEDURE   After the procedure you will be kept in bed lying flat for several hours. You will be instructed to not bend or cross your legs.  The groin insertion site will be watched and checked frequently.  The pulse in your feet will be checked frequently.  Additional blood tests, X-rays and an EKG may be done.  You may stay in the hospital overnight for observation. SEEK IMMEDIATE MEDICAL CARE IF:   You develop chest pain, shortness of breath, feel faint, or pass out.  There is bleeding, swelling, or drainage from the catheter insertion site.  You develop pain, discoloration, coldness, or severe bruising in the leg or area where the catheter was inserted.  You have a fever. Document Released: 07/17/2002 Document Revised: 04/04/2011 Document Reviewed: 09/05/2007 [facility] Patient Information 2014 [place], [place].

## 2012-10-08 NOTE — Telephone Encounter (Signed)
Message copied by Debbe Bales on Mon Oct 08, 2012  2:23 PM ------      Message from: Rosalio Macadamia      Created: Mon Oct 08, 2012  1:05 PM       Will you call her and have her go ahead hold her Metformin starting this evening. Dr. Swaziland will advise her when to restart.            lori ------

## 2012-10-08 NOTE — Telephone Encounter (Signed)
S/w pt will stop metformin tonite and will wait to hear what  Dr. Thomasene Lot advises when to start back on metfomin, pt stated verbal understanding

## 2012-10-09 ENCOUNTER — Encounter (HOSPITAL_COMMUNITY)
Admission: RE | Disposition: A | Discharge status: Home / Self Care | Payer: Medicare Other | Source: Ambulatory Visit | Attending: Cardiology

## 2012-10-09 ENCOUNTER — Ambulatory Visit (HOSPITAL_COMMUNITY)
Admission: RE | Admit: 2012-10-09 | Discharge: 2012-10-09 | Disposition: A | Discharge status: Home / Self Care | Payer: Medicare Other | Source: Ambulatory Visit | Attending: Cardiology | Admitting: Cardiology

## 2012-10-09 DIAGNOSIS — Z9861 Coronary angioplasty status: Secondary | ICD-10-CM | POA: Insufficient documentation

## 2012-10-09 DIAGNOSIS — R0609 Other forms of dyspnea: Secondary | ICD-10-CM | POA: Insufficient documentation

## 2012-10-09 DIAGNOSIS — R0989 Other specified symptoms and signs involving the circulatory and respiratory systems: Secondary | ICD-10-CM | POA: Insufficient documentation

## 2012-10-09 DIAGNOSIS — I2510 Atherosclerotic heart disease of native coronary artery without angina pectoris: Secondary | ICD-10-CM | POA: Insufficient documentation

## 2012-10-09 DIAGNOSIS — R9439 Abnormal result of other cardiovascular function study: Secondary | ICD-10-CM | POA: Insufficient documentation

## 2012-10-09 DIAGNOSIS — Z01810 Encounter for preprocedural cardiovascular examination: Secondary | ICD-10-CM

## 2012-10-09 DIAGNOSIS — R9430 Abnormal result of cardiovascular function study, unspecified: Secondary | ICD-10-CM

## 2012-10-09 HISTORY — PX: LEFT HEART CATHETERIZATION WITH CORONARY ANGIOGRAM: SHX5451

## 2012-10-09 SURGERY — LEFT HEART CATHETERIZATION WITH CORONARY ANGIOGRAM
Anesthesia: LOCAL

## 2012-10-09 MED ORDER — SODIUM CHLORIDE 0.9 % IV SOLN: 250.0000 mL | INTRAVENOUS | Status: DC | PRN

## 2012-10-09 MED ORDER — DIAZEPAM 5 MG PO TABS
ORAL_TABLET | ORAL | Status: AC
  Filled 2012-10-09: qty 1

## 2012-10-09 MED ORDER — VERAPAMIL HCL 2.5 MG/ML IV SOLN
INTRAVENOUS | Status: AC
  Filled 2012-10-09: qty 2

## 2012-10-09 MED ORDER — HEPARIN SODIUM (PORCINE) 1000 UNIT/ML IJ SOLN
INTRAMUSCULAR | Status: AC
  Filled 2012-10-09: qty 1

## 2012-10-09 MED ORDER — FENTANYL CITRATE 0.05 MG/ML IJ SOLN
INTRAMUSCULAR | Status: AC
  Filled 2012-10-09: qty 2

## 2012-10-09 MED ORDER — SODIUM CHLORIDE 0.9 % IV SOLN: 1.0000 mL/kg/h | INTRAVENOUS | Status: DC

## 2012-10-09 MED ORDER — ASPIRIN 81 MG PO CHEW: 324.0000 mg | CHEWABLE_TABLET | ORAL | Status: DC

## 2012-10-09 MED ORDER — HYDROCODONE-ACETAMINOPHEN 5-325 MG PO TABS
1.0000 | ORAL_TABLET | ORAL | Status: DC | PRN
  Administered 2012-10-09: 1 via ORAL
  Filled 2012-10-09: qty 1

## 2012-10-09 MED ORDER — ACETAMINOPHEN 325 MG PO TABS: 650.0000 mg | ORAL_TABLET | ORAL | Status: DC | PRN

## 2012-10-09 MED ORDER — HEPARIN (PORCINE) IN NACL 2-0.9 UNIT/ML-% IJ SOLN
INTRAMUSCULAR | Status: AC
  Filled 2012-10-09: qty 1000

## 2012-10-09 MED ORDER — ASPIRIN 81 MG PO CHEW
CHEWABLE_TABLET | ORAL | Status: AC
  Administered 2012-10-09: 325 mg
  Filled 2012-10-09: qty 4

## 2012-10-09 MED ORDER — DIAZEPAM 5 MG PO TABS
10.0000 mg | ORAL_TABLET | ORAL | Status: AC
  Administered 2012-10-09: 10 mg via ORAL

## 2012-10-09 MED ORDER — LIDOCAINE HCL (PF) 1 % IJ SOLN
INTRAMUSCULAR | Status: AC
  Filled 2012-10-09: qty 30

## 2012-10-09 MED ORDER — NITROGLYCERIN 0.2 MG/ML ON CALL CATH LAB
INTRAVENOUS | Status: AC
  Filled 2012-10-09: qty 1

## 2012-10-09 MED ORDER — SODIUM CHLORIDE 0.9 % IJ SOLN
3.0000 mL | Freq: Two times a day (BID) | INTRAMUSCULAR | Status: DC
Start: 2012-10-09 — End: 2012-10-09

## 2012-10-09 MED ORDER — SODIUM CHLORIDE 0.9 % IV SOLN: INTRAVENOUS | Status: DC

## 2012-10-09 MED ORDER — MIDAZOLAM HCL 2 MG/2ML IJ SOLN
INTRAMUSCULAR | Status: AC
  Filled 2012-10-09: qty 2

## 2012-10-09 MED ORDER — SODIUM CHLORIDE 0.9 % IJ SOLN: 3.0000 mL | INTRAMUSCULAR | Status: DC | PRN

## 2012-10-09 MED ORDER — ONDANSETRON HCL 4 MG/2ML IJ SOLN: 4.0000 mg | Freq: Four times a day (QID) | INTRAMUSCULAR | Status: DC | PRN

## 2012-10-09 NOTE — Interval H&P Note (Signed)
History and Physical Interval Note:  10/09/2012 2:40 PM  [name]  has presented today for surgery, with the diagnosis of Chest pain  The various methods of treatment have been discussed with the patient and family. After consideration of risks, benefits and other options for treatment, the patient has consented to  Procedure(s): LEFT HEART CATHETERIZATION WITH CORONARY ANGIOGRAM (N/A) as a surgical intervention .  The patient's history has been reviewed, patient examined, no change in status, stable for surgery.  I have reviewed the patient's chart and labs.  Questions were answered to the patient's satisfaction.   Cath Lab Visit (complete for each Cath Lab visit)  Clinical Evaluation Leading to the Procedure:   ACS: no  Non-ACS:    Anginal Classification: CCS II  Anti-ischemic medical therapy: Minimal Therapy (1 class of medications)  Non-Invasive Test Results: Intermediate-risk stress test findings: cardiac mortality 1-3%/year  Prior CABG: No previous CABG        Theron Arista [facility] 10/09/2012 2:40 PM

## 2012-10-09 NOTE — H&P (View-Only) (Signed)
 Apple F Behar Date of Birth: 09/17/1940 Medical Record #[id number]  History of Present Illness: [name] is seen back today for a pre cath visit. Seen for Dr. Jordan. She has known CAD with past Taxus stent to the LAD in 2008, HTN, HLD, IDDM and GERD.  Most recently seen back in August - no real symptoms but apparently has never had cardiac symptoms prior to her PCI. We updated her Myoview - this turned out to be abnormal.  Comes back today. Here with her husband. Continues to do well. No symptoms reported. Continues to be fatigued and will have some DOE but no chest pain. Tolerating her medicines.   Current Outpatient Prescriptions  Medication Sig Dispense Refill  . aspirin 81 MG tablet Take 81 mg by mouth daily.      . clopidogrel (PLAVIX) 75 MG tablet take 1 tablet by mouth once daily  30 tablet  5  . estradiol (CLIMARA - DOSED IN MG/24 HR) 0.075 mg/24hr Place 1 patch (0.075 mg total) onto the skin once a week.  4 patch  3  . HYDROcodone-acetaminophen (NORCO/VICODIN) 5-325 MG per tablet Take 1 tablet by mouth as needed.       . insulin glargine (LANTUS SOLOSTAR) 100 UNIT/ML injection Inject 56 Units into the skin 2 (two) times daily.       . METFORMIN HCL PO Take 500 mg by mouth 2 (two) times daily.        . Nebivolol HCl (BYSTOLIC) 20 MG TABS Take 1 tablet (20 mg total) by mouth daily.  30 tablet  3  . omeprazole (PRILOSEC) 20 MG capsule Take 20 mg by mouth daily.        . ramipril (ALTACE) 5 MG tablet Take 5 mg by mouth daily.        . rosuvastatin (CRESTOR) 10 MG tablet Take 10 mg by mouth daily.        . UNKNOWN TO PATIENT Patient is participating in a drug study for cholesterol.....Unknown name of drug       Current Facility-Administered Medications  Medication Dose Route Frequency Provider Last Rate Last Dose  . 0.9 %  sodium chloride infusion  500 mL Intravenous Continuous John N Perry, MD        Allergies  Allergen Reactions  . Erythromycin Other (See Comments)    Mouth ulcers    Past Medical History  Diagnosis Date  . Hypertension   . GERD (gastroesophageal reflux disease)   . Hyperlipidemia   . Depression   . Diabetes mellitus   . OA (osteoarthritis)   . CAD (coronary artery disease)   . Chronic cough   . Cataract   . History of heart artery stent   . DUB (dysfunctional uterine bleeding)   . Endometriosis   . Obesity     Past Surgical History  Procedure Laterality Date  . Appendectomy    . Cholecystectomy    . Angioplasty    . Heart stent    . Tonsillectomy    . Cardiac catheterization  07/17/2006  . Cardiovascular stress test  03/31/2009    EF 72%  . Vaginal hysterectomy  1973    History  Smoking status  . Never Smoker   Smokeless tobacco  . Not on file    History  Alcohol Use  . Yes    Comment: Rare    Family History  Problem Relation Age of Onset  . Lung cancer Father   . Diabetes Maternal Grandmother   .   Hypertension Maternal Grandmother   . Alcohol abuse Mother     Review of Systems: The review of systems is per the HPI.  All other systems were reviewed and are negative.  Physical Exam: Ht 5' 6" (1.676 m)  Wt 217 lb 1.9 oz (98.485 kg)  BMI 35.06 kg/m2 Patient is very pleasant and in no acute distress. She is obese. Skin is warm and dry. Color is normal.  HEENT is unremarkable. Normocephalic/atraumatic. PERRL. Sclera are nonicteric. Neck is supple. No masses. No JVD. Lungs are clear. Cardiac exam shows a regular rate and rhythm. Abdomen is soft. Extremities are without edema. Gait and ROM are intact. No gross neurologic deficits noted.  LABORATORY DATA: PENDING  Lab Results  Component Value Date   WBC 5.6 07/18/2006   HGB 12.5 07/18/2006   HCT 37.3 07/18/2006   PLT 280 07/18/2006   GLUCOSE 181* 07/18/2006   NA 139 07/18/2006   K 4.0 07/18/2006   CL 107 07/18/2006   CREATININE 0.66 07/18/2006   BUN 7 07/18/2006   CO2 26 07/18/2006   INR 1.0 07/17/2006   Myoview Impression  Exercise Capacity: Lexiscan with  low level exercise.  BP Response: Normal blood pressure response.  Clinical Symptoms: Typical chest pain.  ECG Impression: No significant ST segment change suggestive of ischemia.  Comparison with Prior Nuclear Study:   Overall Impression: Intermediate risk stress nuclear study There is a small size, severe reversible defect in the mid and distal anterior wall (2 segments) consistent with ischemia in the distal LAD territory.   LV Ejection Fraction: 58%. LV Wall Motion: NL LV Function; NL Wall Motion    Assessment / Plan: 1. Known CAD with past stenting of the LAD - now with abnormal Myoview - remains asymptomatic and has never had cardiac symptoms in the past - will need repeat cath to further define - the procedure has been discussed in full detail and she is willing to proceed. Case scheduled for tomorrow with Dr. Jordan at [facility].   2. HTN - BP looks good.   3. HLD - in a study for her lipids.   4. IDDM - managed by PCP - will need to do half dose tonight and none in the am in light of her upcoming procedure.   Further disposition to follow.   Patient is agreeable to this plan and will call if any problems develop in the interim.   Ostin Mathey C. Yuriana Gaal, RN, ANP-C [facility] [address] [place], [place]  [address]  

## 2012-10-09 NOTE — CV Procedure (Signed)
   Cardiac Catheterization Procedure Note  Name: PATINA SPANIER MRN: [id number] DOB: [birth year]  Procedure: Left Heart Cath, Selective Coronary Angiography, LV angiography  Indication: 72 yo WF with history of CAD s/p Taxus stent to the proximal LAD in 2008. She has some recent symptoms of dyspnea. Stress myoview was an intermediate risk study with some anterior ischemia.   Procedural Details: The right wrist was prepped, draped, and anesthetized with 1% lidocaine. Using the modified Seldinger technique, a 5 French sheath was introduced into the right radial artery. 3 mg of verapamil was administered through the sheath, weight-based unfractionated heparin was administered intravenously. Standard Judkins catheters were used for selective coronary angiography and left ventriculography. Catheter exchanges were performed over an exchange length guidewire. There were no immediate procedural complications. A TR band was used for radial hemostasis at the completion of the procedure.  The patient was transferred to the post catheterization recovery area for further monitoring.  Procedural Findings: Hemodynamics: AO 135/60 mean 91 mm Hg LV 139/13 mm Hg  Coronary angiography: Coronary dominance: right  Left mainstem: Normal.  Left anterior descending (LAD):  There is a stent in the proximal vessel that is widely patent. Less than 20% disease. The first diagonal is normal.  Left circumflex (LCx): Normal.  Right coronary artery (RCA): Normal.  Left ventriculography: Left ventricular systolic function is normal, LVEF is estimated at 55-65%, there is no significant mitral regurgitation   Final Conclusions:   1. Nonobstructive CAD. The stent in the proximal LAD is widely patent. 2. Normal LV function.  Recommendations: Continue medical therapy.  Theron Arista [facility] 10/09/2012, 3:12 PM

## 2012-10-15 ENCOUNTER — Encounter: Payer: Self-pay | Admitting: Gynecology

## 2012-10-15 ENCOUNTER — Ambulatory Visit (INDEPENDENT_AMBULATORY_CARE_PROVIDER_SITE_OTHER): Payer: Medicare Other | Admitting: Gynecology

## 2012-10-15 ENCOUNTER — Ambulatory Visit (INDEPENDENT_AMBULATORY_CARE_PROVIDER_SITE_OTHER): Payer: Medicare Other

## 2012-10-15 DIAGNOSIS — N83339 Acquired atrophy of ovary and fallopian tube, unspecified side: Secondary | ICD-10-CM

## 2012-10-15 DIAGNOSIS — N7013 Chronic salpingitis and oophoritis: Secondary | ICD-10-CM

## 2012-10-15 DIAGNOSIS — N7011 Chronic salpingitis: Secondary | ICD-10-CM

## 2012-10-15 DIAGNOSIS — R102 Pelvic and perineal pain: Secondary | ICD-10-CM

## 2012-10-15 DIAGNOSIS — N949 Unspecified condition associated with female genital organs and menstrual cycle: Secondary | ICD-10-CM

## 2012-10-15 NOTE — Progress Notes (Signed)
History of hydrosalpinx with ultrasound 2005 noting a tortuous tubular echo-free cystic structure measuring 53 x 24 x 23 mm. Note on report said unchanged for 5 years. Consistent with left hydrosalpinx.  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 Negative cul-de-sac.  The patient presents now for followup ultrasound for stability.  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 Cul-de-sac negative. Right and Left ovaries atrophic.  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 Stable over time.  As discussed previously patient understands no guarantees that this is not malignant but highly unlikely. Options for continued observation versus surgical intervention reviewed. Patient's comfortable with observation in fact stated that she does not want to have any further ultrasounds which we will rediscuss when I see her July 2015 for her annual followup.

## 2012-10-15 NOTE — Patient Instructions (Signed)
Followup for your bone density scheduled next month and then for your annual exam in July 2015

## 2012-10-18 ENCOUNTER — Ambulatory Visit: Payer: Medicare Other | Admitting: Nurse Practitioner

## 2012-10-25 ENCOUNTER — Ambulatory Visit (INDEPENDENT_AMBULATORY_CARE_PROVIDER_SITE_OTHER): Payer: Medicare Other

## 2012-10-25 DIAGNOSIS — Z780 Asymptomatic menopausal state: Secondary | ICD-10-CM

## 2012-12-10 ENCOUNTER — Other Ambulatory Visit: Payer: Self-pay | Admitting: Cardiology

## 2013-01-19 ENCOUNTER — Other Ambulatory Visit: Payer: Self-pay | Admitting: Cardiology

## 2013-02-21 ENCOUNTER — Encounter: Payer: Self-pay | Admitting: Gynecology

## 2013-02-28 ENCOUNTER — Other Ambulatory Visit: Payer: Self-pay | Admitting: *Deleted

## 2013-02-28 MED ORDER — NEBIVOLOL HCL 10 MG PO TABS: 20.0000 mg | ORAL_TABLET | Freq: Every day | ORAL | Status: DC

## 2013-04-08 ENCOUNTER — Encounter: Payer: Self-pay | Admitting: Cardiology

## 2013-05-22 ENCOUNTER — Telehealth: Payer: Self-pay | Admitting: *Deleted

## 2013-05-22 NOTE — Telephone Encounter (Signed)
Patient was seen on 05/20/13 for Accelerate Study.Patient had K+ of 5.48mEQ/L. Dr. Stanford Breed reviewed labs and wanted to repeat in 1 week. Patient is going for cruise for 2 weeks.We will repeat labs 05/23/13 prior to patient leaving for trip. Patient was instructed to eat low K+ diet and patient is not on any potassium supplements.

## 2013-08-15 ENCOUNTER — Encounter: Payer: Self-pay | Admitting: Gynecology

## 2013-08-15 ENCOUNTER — Ambulatory Visit (INDEPENDENT_AMBULATORY_CARE_PROVIDER_SITE_OTHER): Payer: Medicare Other | Admitting: Gynecology

## 2013-08-15 VITALS — BP 116/76 | Ht 65.75 in | Wt 211.0 lb

## 2013-08-15 DIAGNOSIS — N951 Menopausal and female climacteric states: Secondary | ICD-10-CM

## 2013-08-15 DIAGNOSIS — N952 Postmenopausal atrophic vaginitis: Secondary | ICD-10-CM

## 2013-08-15 DIAGNOSIS — N816 Rectocele: Secondary | ICD-10-CM

## 2013-08-15 DIAGNOSIS — N7013 Chronic salpingitis and oophoritis: Secondary | ICD-10-CM

## 2013-08-15 DIAGNOSIS — N7011 Chronic salpingitis: Secondary | ICD-10-CM

## 2013-08-15 MED ORDER — ESTRADIOL 0.05 MG/24HR TD PTWK: 0.0500 mg | MEDICATED_PATCH | TRANSDERMAL | Status: DC

## 2013-08-15 NOTE — Patient Instructions (Addendum)
Start back on the estrogen patches as we discussed. Call me if he has any issues with this. Followup for ultrasound in the fall of 2015 Followup in one year for annual exam.  Hormone Therapy At menopause, your body begins making less estrogen and progesterone hormones. This causes the body to stop having menstrual periods. This is because estrogen and progesterone hormones control your periods and menstrual cycle. A lack of estrogen may cause symptoms such as:  Hot flushes (or hot flashes).  Vaginal dryness.  Dry skin.  Loss of sex drive.  Risk of bone loss (osteoporosis). When this happens, you may choose to take hormone therapy to get back the estrogen lost during menopause. When the hormone estrogen is given alone, it is usually referred to as ET (Estrogen Therapy). When the hormone progestin is combined with estrogen, it is generally called HT (Hormone Therapy). This was formerly known as hormone replacement therapy (HRT). Your caregiver can help you make a decision on what will be best for you. The decision to use HT seems to change often as new studies are done. Many studies do not agree on the benefits of hormone replacement therapy. LIKELY BENEFITS OF HT INCLUDE PROTECTION FROM:  Hot Flushes (also called hot flashes) - A hot flush is a sudden feeling of heat that spreads over the face and body. The skin may redden like a blush. It is connected with sweats and sleep disturbance. Women going through menopause may have hot flushes a few times a month or several times per day depending on the woman.  Osteoporosis (bone loss)- Estrogen helps guard against bone loss. After menopause, a woman's bones slowly lose calcium and become weak and brittle. As a result, bones are more likely to break. The hip, wrist, and spine are affected most often. Hormone therapy can help slow bone loss after menopause. Weight bearing exercise and taking calcium with vitamin D also can help prevent bone loss. There  are also medications that your caregiver can prescribe that can help prevent osteoporosis.  Vaginal Dryness - Loss of estrogen causes changes in the vagina. Its lining may become thin and dry. These changes can cause pain and bleeding during sexual intercourse. Dryness can also lead to infections. This can cause burning and itching. (Vaginal estrogen treatment can help relieve pain, itching, and dryness.)  Urinary Tract Infections are more common after menopause because of lack of estrogen. Some women also develop urinary incontinence because of low estrogen levels in the vagina and bladder.  Possible other benefits of estrogen include a positive effect on mood and short-term memory in women. RISKS AND COMPLICATIONS  Using estrogen alone without progesterone causes the lining of the uterus to grow. This increases the risk of lining of the uterus (endometrial) cancer. Your caregiver should give another hormone called progestin if you have a uterus.  Women who take combined (estrogen and progestin) HT appear to have an increased risk of breast cancer. The risk appears to be small, but increases throughout the time that HT is taken.  Combined therapy also makes the breast tissue slightly denser which makes it harder to read mammograms (breast X-rays).  Combined, estrogen and progesterone therapy can be taken together every day, in which case there may be spotting of blood. HT therapy can be taken cyclically in which case you will have menstrual periods. Cyclically means HT is taken for a set amount of days, then not taken, then this process is repeated.  HT may increase the risk of  stroke, heart attack, breast cancer and forming blood clots in your leg.  Transdermal estrogen (estrogen that is absorbed through the skin with a patch or a cream) may have more positive results with:  Cholesterol.  Blood pressure.  Blood clots. Having the following conditions may indicate you should not have  HT:  Endometrial cancer.  Liver disease.  Breast cancer.  Heart disease.  History of blood clots.  Stroke. TREATMENT   If you choose to take HT and have a uterus, usually estrogen and progestin are prescribed.  Your caregiver will help you decide the best way to take the medications.  Possible ways to take estrogen include:  Pills.  Patches.  Gels.  Sprays.  Vaginal estrogen cream, rings and tablets.  It is best to take the lowest dose possible that will help your symptoms and take them for the shortest period of time that you can.  Hormone therapy can help relieve some of the problems (symptoms) that affect women at menopause. Before making a decision about HT, talk to your caregiver about what is best for you. Be well informed and comfortable with your decisions. HOME CARE INSTRUCTIONS   Follow your caregivers advice when taking the medications.  A Pap test is done to screen for cervical cancer.  The first Pap test should be done at age 31.  Between ages 32 and 1, Pap tests are repeated every 2 years.  Beginning at age 4, you are advised to have a Pap test every 3 years as long as your past 3 Pap tests have been normal.  Some women have medical problems that increase the chance of getting cervical cancer. Talk to your caregiver about these problems. It is especially important to talk to your caregiver if a new problem develops soon after your last Pap test. In these cases, your caregiver may recommend more frequent screening and Pap tests.  The above recommendations are the same for women who have or have not gotten the vaccine for HPV (Human Papillomavirus).  If you had a hysterectomy for a problem that was not a cancer or a condition that could lead to cancer, then you no longer need Pap tests. However, even if you no longer need a Pap test, a regular exam is a good idea to make sure no other problems are starting.   If you are between ages 42 and 74, and  you have had normal Pap tests going back 10 years, you no longer need Pap tests. However, even if you no longer need a Pap test, a regular exam is a good idea to make sure no other problems are starting.   If you have had past treatment for cervical cancer or a condition that could lead to cancer, you need Pap tests and screening for cancer for at least 20 years after your treatment.  If Pap tests have been discontinued, risk factors (such as a new sexual partner) need to be re-assessed to determine if screening should be resumed.  Some women may need screenings more often if they are at high risk for cervical cancer.  Get mammograms done as per the advice of your caregiver. SEEK IMMEDIATE MEDICAL CARE IF:  You develop abnormal vaginal bleeding.  You have pain or swelling in your legs, shortness of breath, or chest pain.  You develop dizziness or headaches.  You have lumps or changes in your breasts or armpits.  You have slurred speech.  You develop weakness or numbness of your arms or legs.  You have pain, burning, or bleeding when urinating.  You develop abdominal pain. Document Released: 10/09/2002 Document Revised: 04/04/2011 Document Reviewed: 01/27/2010 [facility] Patient Information 2015 [place], [place]. This information is not intended to replace advice given to you by your health care provider. Make sure you discuss any questions you have with your health care provider.

## 2013-08-15 NOTE — Progress Notes (Signed)
KAZUKO CLEMENCE 23-Oct-1940 [id number]        73 y.o.  G2P2002 for annual exam.  Several issues noted below.  Past medical history,surgical history, problem list, medications, allergies, family history and social history were all reviewed and documented as reviewed in the EPIC chart.  ROS:  12 system ROS performed with pertinent positives and negatives included in the history, assessment and plan.   Additional significant findings :  None   Exam: Sharrie Rothman assistant Filed Vitals:   08/15/13 0957  BP: 116/76  Height: 5' 5.75" (1.67 m)  Weight: 211 lb (95.709 kg)   General appearance:  Normal affect, orientation and appearance. Skin: Grossly normal HEENT: Without gross lesions.  No cervical or supraclavicular adenopathy. Thyroid normal.  Lungs:  Clear without wheezing, rales or rhonchi Cardiac: RR, without RMG Abdominal:  Soft, nontender, without masses, guarding, rebound, organomegaly or hernia Breasts:  Examined lying and sitting without masses, retractions, discharge or axillary adenopathy. Pelvic:  Ext/BUS/vagina with generalized atrophic changes. First degree rectocele noted. Cuff well supported. No significant cystocele   Adnexa  Without masses or tenderness    Anus and perineum  Normal   Rectovaginal  Normal sphincter tone without palpated masses or tenderness.    Assessment/Plan:  73 y.o. [id number] female for followup exam.   1. Hydrosalpinx. History of hydrosalpinx with ultrasound 2005 noting a tortuous tubular echo-free cystic structure measuring 53 x 24 x 23 mm. Note on report said unchanged for 5 years. Consistent with left hydrosalpinx.  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 Negative cul-de-sac. Repeat ultrasound 09/2012 measurement 75 x 67 x 32 mm avascular. Options to stop screening altogether versus repeat ultrasound discussed and we both agree on ultrasound this coming fall at a one-year interval. Her  exam today is normal she has no symptoms such as discomfort bloating or other abdominal complaints. 2. Menopausal symptoms. Patient has been on Climara 0.1 mg patches. She discontinued last year and since then has been having significant night sweats and sleep disturbances.  I again reviewed the whole issue of HRT with her to include the WHI study with increased risk of stroke, heart attack, DVT and breast cancer. The ACOG and NAMS statements for lowest dose for the shortest period of time reviewed. Transdermal versus oral first-pass effect benefit discussed.  Unusual circumstance to a reinitiate in a 73 year old woman with potentially increased risk of stroke and other thrombotic events discussed. She has tried over-the-counter soy-based without relief. She feels it is a quality of life issue and is miserable at this time and requests to restart ERT excepting the risks. We'll go ahead and start at 0.05 Climara and see how she does with that she'll call me if she has any issues. 3. Atrophic genital changes. Patient is asymptomatic from this and is not sexually active. Continue to monitor. 4. Rectocele. Patient has stable first degree rectocele without symptoms. Will continue to monitor. 5. DEXA 2014 normal. Repeated five-year interval. Increase calcium vitamin D reviewed. 6. Pap smear 2012. The Pap smear done today. No history of abnormal Pap smears previously. Over the age of 53 and status post hysterectomy for benign indications. We both agree on stop screening per current screening guidelines and she is comfortable with this. 7. Mammography 01/2013. Continue with annual mammography. SBE monthly reviewed. 8. Colonoscopy 2012. Repeat at their recommended interval. 9. Health maintenance. No routine blood work done as this is done through her  primary physician's office. Followup in one year, sooner as needed.   Note: This document was prepared with digital dictation and possible smart phrase technology. Any  transcriptional errors that result from this process are unintentional.   [name] MD, 10:32 AM 08/15/2013

## 2013-08-16 LAB — URINALYSIS W MICROSCOPIC + REFLEX CULTURE
BACTERIA UA: NONE SEEN
Bilirubin Urine: NEGATIVE
Casts: NONE SEEN
Crystals: NONE SEEN
Glucose, UA: >1000 mg/dL — AB
Hgb urine dipstick: NEGATIVE
KETONES UR: NEGATIVE mg/dL
Leukocytes, UA: NEGATIVE
NITRITE: NEGATIVE
Protein, ur: NEGATIVE mg/dL
Specific Gravity, Urine: 1.026 (ref 1.005–1.030)
UROBILINOGEN UA: 0.2 mg/dL (ref 0.0–1.0)
pH: 5.5 (ref 5.0–8.0)

## 2013-08-18 LAB — URINE CULTURE: Colony Count: 45000

## 2013-08-19 ENCOUNTER — Telehealth: Payer: Self-pay | Admitting: *Deleted

## 2013-08-19 ENCOUNTER — Other Ambulatory Visit: Payer: Self-pay | Admitting: Gynecology

## 2013-08-19 MED ORDER — CIPROFLOXACIN HCL 250 MG PO TABS: 250.0000 mg | ORAL_TABLET | Freq: Two times a day (BID) | ORAL | Status: DC

## 2013-08-19 NOTE — Telephone Encounter (Signed)
PA form filled out and faxed to [facility] for climara 0.05mg  patch, will wait for response.

## 2013-08-20 NOTE — Telephone Encounter (Signed)
Patch approved effective 08/19/13 for 1 year per [employer].

## 2013-09-17 ENCOUNTER — Encounter: Payer: Self-pay | Admitting: Cardiology

## 2013-09-17 ENCOUNTER — Ambulatory Visit (INDEPENDENT_AMBULATORY_CARE_PROVIDER_SITE_OTHER): Payer: Medicare Other | Admitting: Cardiology

## 2013-09-17 VITALS — BP 122/74 | HR 64 | Ht 65.0 in | Wt 206.2 lb

## 2013-09-17 DIAGNOSIS — I10 Essential (primary) hypertension: Secondary | ICD-10-CM

## 2013-09-17 DIAGNOSIS — E785 Hyperlipidemia, unspecified: Secondary | ICD-10-CM

## 2013-09-17 DIAGNOSIS — I2510 Atherosclerotic heart disease of native coronary artery without angina pectoris: Secondary | ICD-10-CM

## 2013-09-17 MED ORDER — NEBIVOLOL HCL 10 MG PO TABS: 10.0000 mg | ORAL_TABLET | Freq: Every day | ORAL | Status: DC

## 2013-09-17 NOTE — Patient Instructions (Signed)
Continue your current therapy  I will see you in 6 months.  Try and exercise!!

## 2013-09-17 NOTE — Progress Notes (Signed)
[name] Date of Birth: [birth year] Medical Record #[id number]  History of Present Illness: [name] is seen for follow up. She has known CAD with past Taxus stent to the LAD in 2008, HTN, HLD, IDDM and GERD.Last August she had an abnormal Myoview study. Cardiac cath showed nonobstructive CAD.  On follow up today she is doing well.  No symptoms reported. No chest pain or SOB. She has lost 11 lbs without really trying. Still is very sedentary. She is on a study drug for hypercholesterolemia. She reports her last A1c was 6.2%.   Current Outpatient Prescriptions  Medication Sig Dispense Refill  . Canagliflozin (INVOKANA) 300 MG TABS Take by mouth.      . ciprofloxacin (CIPRO) 250 MG tablet Take 1 tablet (250 mg total) by mouth 2 (two) times daily.  6 tablet  0  . estradiol (CLIMARA - DOSED IN MG/24 HR) 0.05 mg/24hr patch Place 1 patch (0.05 mg total) onto the skin once a week.  4 patch  12  . HYDROcodone-acetaminophen (NORCO/VICODIN) 5-325 MG per tablet Take 1 tablet by mouth as needed.       . insulin glargine (LANTUS SOLOSTAR) 100 UNIT/ML injection Inject 56 Units into the skin 2 (two) times daily.       Marland Kitchen MAGNESIUM PO Take by mouth.      . METFORMIN HCL PO Take 500 mg by mouth 2 (two) times daily.        . nebivolol (BYSTOLIC) 10 MG tablet Take 1 tablet (10 mg total) by mouth daily.  60 tablet  3  . omeprazole (PRILOSEC) 20 MG capsule Take 20 mg by mouth daily.        . ramipril (ALTACE) 5 MG tablet Take 5 mg by mouth daily.        . rosuvastatin (CRESTOR) 10 MG tablet Take 10 mg by mouth daily.        Marland Kitchen UNKNOWN TO PATIENT Patient is participating in a drug study for cholesterol...Marland KitchenMarland KitchenUnknown name of drug      . aspirin 81 MG tablet Take 81 mg by mouth daily.      . clopidogrel (PLAVIX) 75 MG tablet take 1 tablet by mouth once daily  30 tablet  5   Current Facility-Administered Medications  Medication Dose Route Frequency Provider Last Rate Last Dose  . 0.9 %  sodium chloride  infusion  500 mL Intravenous Continuous Irene Shipper, MD        Allergies  Allergen Reactions  . Erythromycin Other (See Comments)    Mouth ulcers    Past Medical History  Diagnosis Date  . Hypertension   . GERD (gastroesophageal reflux disease)   . Hyperlipidemia   . Depression   . Diabetes mellitus   . OA (osteoarthritis)   . CAD (coronary artery disease)   . Chronic cough   . Cataract   . History of heart artery stent   . DUB (dysfunctional uterine bleeding)   . Endometriosis   . Obesity     Past Surgical History  Procedure Laterality Date  . Appendectomy    . Cholecystectomy    . Angioplasty    . Heart stent    . Tonsillectomy    . Cardiac catheterization  07/17/2006  . Cardiovascular stress test  03/31/2009    EF 72%  . Vaginal hysterectomy  1973    History  Smoking status  . Never Smoker   Smokeless tobacco  . Never Used    History  Alcohol Use  . Yes    Comment: Rare    Family History  Problem Relation Age of Onset  . Lung cancer Father   . Diabetes Maternal Grandmother   . Hypertension Maternal Grandmother   . Alcohol abuse Mother     Review of Systems: The review of systems is per the HPI.  All other systems were reviewed and are negative.  Physical Exam: BP 122/74  Pulse 64  Ht 5\' 5"  (1.651 m)  Wt 206 lb 3 oz (93.526 kg)  BMI 34.31 kg/m2 Patient is very pleasant and in no acute distress. She is obese. Skin is warm and dry. Color is normal.  HEENT is unremarkable. Normocephalic/atraumatic. PERRL. Sclera are nonicteric. Neck is supple. No masses. No JVD. Lungs are clear. Cardiac exam shows a regular rate and rhythm. Abdomen is soft. Extremities are without edema. Gait and ROM are intact. No gross neurologic deficits noted.  LABORATORY DATA: PENDING  Lab Results  Component Value Date   WBC 9.9 10/08/2012   HGB 14.9 10/08/2012   HCT 44.4 10/08/2012   PLT 304.0 10/08/2012   GLUCOSE 119* 10/08/2012   NA 138 10/08/2012   K 4.3 10/08/2012   CL  106 10/08/2012   CREATININE 0.7 10/08/2012   BUN 15 10/08/2012   CO2 27 10/08/2012   INR 1.1* 10/08/2012   Ecg: NSR , normal.  Assessment / Plan: 1. Known CAD with past stenting of the LAD - Cardiac cath Sept. 2014 showed nonobstructive CAD. Continue medical Rx. Encourage increase aerobic activity.  2. HTN - controlled.   3. HLD - in a study for her lipids.   4. IDDM - Controlled.

## 2013-10-25 ENCOUNTER — Ambulatory Visit (INDEPENDENT_AMBULATORY_CARE_PROVIDER_SITE_OTHER): Payer: Medicare Other

## 2013-10-25 ENCOUNTER — Ambulatory Visit (INDEPENDENT_AMBULATORY_CARE_PROVIDER_SITE_OTHER): Payer: Medicare Other | Admitting: Gynecology

## 2013-10-25 ENCOUNTER — Encounter: Payer: Self-pay | Admitting: Gynecology

## 2013-10-25 VITALS — Wt 208.0 lb

## 2013-10-25 DIAGNOSIS — N7011 Chronic salpingitis: Secondary | ICD-10-CM

## 2013-10-25 NOTE — Progress Notes (Signed)
[name] 1940/02/26 [id number]        73 y.o.  G2P2002 presents in follow up for ultrasound.  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 Ultrasound 2005 showed tortuous tubular echo-free cyst measuring 53 x 24 x 23 mm. Note on the report states unchanged for 5 years. Ultrasound 04/2012 measurement 75 x 67 x 32 mm. Patient is having no symptoms attributable to this.  Past medical history,surgical history, problem list, medications, allergies, family history and social history were all reviewed and documented in the EPIC chart.  Directed ROS with pertinent positives and negatives documented in the history of present illness/assessment and plan.  Ultrasound shows right adnexa normal. Right ovary not identified. Left ovary appears normal. Left adnexa with thin-walled avascular serpentine mass 68 x 29 x 28 mm. No free fluid.  Assessment/Plan:  73 y.o. [id number] with persistent left tubular cystic avascular mass consistent with hydrosalpinx. Measurement shows stability if not smaller than 2014 ultrasound. Plan expectant management at this time she is asymptomatic. Patient requests we stopped following this ultrasonic graphically since it does not seem to change which I think is reasonable. Patient will follow up next summer when she is due for her annual exam.     [name] MD, 10:11 AM 10/25/2013

## 2013-10-25 NOTE — Patient Instructions (Signed)
Followup in one year for annual exam, sooner if any issues 

## 2013-11-25 ENCOUNTER — Encounter: Payer: Self-pay | Admitting: Gynecology

## 2013-12-18 ENCOUNTER — Telehealth: Payer: Self-pay

## 2013-12-18 DIAGNOSIS — E785 Hyperlipidemia, unspecified: Secondary | ICD-10-CM

## 2013-12-18 NOTE — Telephone Encounter (Signed)
Spoke to [facility] 12/12/13.She stated patient has finished with excelerated study.Stated she will need a lipid panel in 02/2014.Lab order mailed to patient.

## 2014-01-02 ENCOUNTER — Encounter (HOSPITAL_COMMUNITY): Payer: Self-pay | Admitting: Cardiology

## 2014-01-14 ENCOUNTER — Encounter: Payer: Self-pay | Admitting: Cardiology

## 2014-02-13 ENCOUNTER — Encounter: Payer: Self-pay | Admitting: Cardiology

## 2014-02-13 ENCOUNTER — Ambulatory Visit (INDEPENDENT_AMBULATORY_CARE_PROVIDER_SITE_OTHER): Payer: Commercial Managed Care - HMO | Admitting: Cardiology

## 2014-02-13 VITALS — BP 123/68 | HR 67 | Ht 66.0 in | Wt 207.2 lb

## 2014-02-13 DIAGNOSIS — E785 Hyperlipidemia, unspecified: Secondary | ICD-10-CM | POA: Diagnosis not present

## 2014-02-13 DIAGNOSIS — I2510 Atherosclerotic heart disease of native coronary artery without angina pectoris: Secondary | ICD-10-CM | POA: Diagnosis not present

## 2014-02-13 DIAGNOSIS — I10 Essential (primary) hypertension: Secondary | ICD-10-CM

## 2014-02-13 NOTE — Patient Instructions (Signed)
Continue your current therapy  I will see you in 6 months.   

## 2014-02-13 NOTE — Progress Notes (Signed)
[name] Date of Birth: 06-14-1940 Medical Record #[id number]  History of Present Illness: [name] is seen for follow up CAD. She has known CAD with past Taxus stent to the LAD in 2008, HTN, HLD, IDDM and GERD.In September 2014 she had an abnormal Myoview study. Cardiac cath showed nonobstructive CAD.  On follow up today she is doing well.  No symptoms reported. No chest pain or SOB. Weight is stable. Still is very sedentary. The study she was on for her cholesterol has been terminated. She was recently at a trade show and ate out for 19 straight days. Her blood sugar was high but since she has returned home they have improved.  Current Outpatient Prescriptions  Medication Sig Dispense Refill  . aspirin 81 MG tablet Take 81 mg by mouth daily.    . Canagliflozin (INVOKANA) 300 MG TABS Take by mouth.    . clopidogrel (PLAVIX) 75 MG tablet take 1 tablet by mouth once daily 30 tablet 5  . estradiol (CLIMARA - DOSED IN MG/24 HR) 0.05 mg/24hr patch Place 1 patch (0.05 mg total) onto the skin once a week. 4 patch 12  . HYDROcodone-acetaminophen (NORCO/VICODIN) 5-325 MG per tablet Take 1 tablet by mouth as needed.     Marland Kitchen MAGNESIUM PO Take by mouth.    . METFORMIN HCL PO Take 500 mg by mouth 2 (two) times daily.      . nebivolol (BYSTOLIC) 10 MG tablet Take 1 tablet (10 mg total) by mouth daily. 60 tablet 3  . omeprazole (PRILOSEC) 20 MG capsule Take 20 mg by mouth daily.      . ramipril (ALTACE) 5 MG tablet Take 5 mg by mouth daily.      . rosuvastatin (CRESTOR) 10 MG tablet Take 10 mg by mouth daily.      [name] 300 UNIT/ML SOPN Inject 72 Units as directed daily.  0  . UNKNOWN TO PATIENT Patient is participating in a drug study for cholesterol...Marland KitchenMarland KitchenUnknown name of drug     Current Facility-Administered Medications  Medication Dose Route Frequency Provider Last Rate Last Dose  . 0.9 %  sodium chloride infusion  500 mL Intravenous Continuous Irene Shipper, MD         Allergies  Allergen Reactions  . Erythromycin Other (See Comments)    Mouth ulcers    Past Medical History  Diagnosis Date  . Hypertension   . GERD (gastroesophageal reflux disease)   . Hyperlipidemia   . Depression   . Diabetes mellitus   . OA (osteoarthritis)   . CAD (coronary artery disease)   . Chronic cough   . Cataract   . History of heart artery stent   . DUB (dysfunctional uterine bleeding)   . Endometriosis   . Obesity     Past Surgical History  Procedure Laterality Date  . Appendectomy    . Cholecystectomy    . Angioplasty    . Heart stent    . Tonsillectomy    . Cardiac catheterization  07/17/2006  . Cardiovascular stress test  03/31/2009    EF 72%  . Vaginal hysterectomy  1973  . Left heart catheterization with coronary angiogram N/A 10/09/2012    Procedure: LEFT HEART CATHETERIZATION WITH CORONARY ANGIOGRAM;  Surgeon: Peter M Martinique, MD;  Location[facility] CATH LAB;  Service: Cardiovascular;  Laterality: N/A;    History  Smoking status  . Never Smoker   Smokeless tobacco  . Never Used    History  Alcohol Use  . Yes    Comment: Rare    Family History  Problem Relation Age of Onset  . Lung cancer Father   . Diabetes Maternal Grandmother   . Hypertension Maternal Grandmother   . Alcohol abuse Mother     Review of Systems: The review of systems is per the HPI.  All other systems were reviewed and are negative.  Physical Exam: BP 123/68 mmHg  Pulse 67  Ht 5\' 6"  (1.676 m)  Wt 207 lb 3.2 oz (93.985 kg)  BMI 33.46 kg/m2 Patient is very pleasant and in no acute distress. She is obese. Skin is warm and dry. Color is normal.  HEENT is unremarkable. Normocephalic/atraumatic. PERRL. Sclera are nonicteric. Neck is supple. No masses. No JVD. Lungs are clear. Cardiac exam shows a regular rate and rhythm. Abdomen is soft. Extremities are without edema. Gait and ROM are intact. No gross neurologic deficits noted.  LABORATORY DATA:     Assessment /  Plan: 1. Known CAD with past stenting of the LAD n2008 - Cardiac cath Sept. 2014 showed nonobstructive CAD. Continue medical Rx. Encourage increase aerobic activity.  2. HTN - controlled.   3. HLD - labs followed by primary care. Continue Crestor.  4. IDDM

## 2014-02-20 ENCOUNTER — Telehealth: Payer: Self-pay | Admitting: *Deleted

## 2014-02-20 NOTE — Telephone Encounter (Signed)
PRIOR AUTHORIZATION DONE ONLINE FOR CLIMARA 0.05 MG PATCH, WILL WAIT FOR RESPONSE.

## 2014-02-24 NOTE — Telephone Encounter (Signed)
Patch was approved until 01/24/15.

## 2014-03-12 DIAGNOSIS — Z6832 Body mass index (BMI) 32.0-32.9, adult: Secondary | ICD-10-CM | POA: Diagnosis not present

## 2014-03-12 DIAGNOSIS — E785 Hyperlipidemia, unspecified: Secondary | ICD-10-CM | POA: Diagnosis not present

## 2014-03-12 DIAGNOSIS — Z5181 Encounter for therapeutic drug level monitoring: Secondary | ICD-10-CM | POA: Diagnosis not present

## 2014-03-12 DIAGNOSIS — E669 Obesity, unspecified: Secondary | ICD-10-CM | POA: Diagnosis not present

## 2014-03-12 DIAGNOSIS — E1151 Type 2 diabetes mellitus with diabetic peripheral angiopathy without gangrene: Secondary | ICD-10-CM | POA: Diagnosis not present

## 2014-03-12 DIAGNOSIS — I2510 Atherosclerotic heart disease of native coronary artery without angina pectoris: Secondary | ICD-10-CM | POA: Diagnosis not present

## 2014-03-12 DIAGNOSIS — F331 Major depressive disorder, recurrent, moderate: Secondary | ICD-10-CM | POA: Diagnosis not present

## 2014-03-12 DIAGNOSIS — I10 Essential (primary) hypertension: Secondary | ICD-10-CM | POA: Diagnosis not present

## 2014-04-02 ENCOUNTER — Other Ambulatory Visit: Payer: Self-pay | Admitting: Dermatology

## 2014-04-02 DIAGNOSIS — L820 Inflamed seborrheic keratosis: Secondary | ICD-10-CM | POA: Diagnosis not present

## 2014-04-02 DIAGNOSIS — D485 Neoplasm of uncertain behavior of skin: Secondary | ICD-10-CM | POA: Diagnosis not present

## 2014-04-02 DIAGNOSIS — L919 Hypertrophic disorder of the skin, unspecified: Secondary | ICD-10-CM | POA: Diagnosis not present

## 2014-04-02 DIAGNOSIS — L814 Other melanin hyperpigmentation: Secondary | ICD-10-CM | POA: Diagnosis not present

## 2014-04-02 DIAGNOSIS — L821 Other seborrheic keratosis: Secondary | ICD-10-CM | POA: Diagnosis not present

## 2014-05-16 DIAGNOSIS — H25012 Cortical age-related cataract, left eye: Secondary | ICD-10-CM | POA: Diagnosis not present

## 2014-05-16 DIAGNOSIS — H25042 Posterior subcapsular polar age-related cataract, left eye: Secondary | ICD-10-CM | POA: Diagnosis not present

## 2014-05-16 DIAGNOSIS — E119 Type 2 diabetes mellitus without complications: Secondary | ICD-10-CM | POA: Diagnosis not present

## 2014-05-16 DIAGNOSIS — H2512 Age-related nuclear cataract, left eye: Secondary | ICD-10-CM | POA: Diagnosis not present

## 2014-06-13 DIAGNOSIS — Z1231 Encounter for screening mammogram for malignant neoplasm of breast: Secondary | ICD-10-CM | POA: Diagnosis not present

## 2014-06-17 ENCOUNTER — Encounter: Payer: Self-pay | Admitting: Gynecology

## 2014-07-03 DIAGNOSIS — L820 Inflamed seborrheic keratosis: Secondary | ICD-10-CM | POA: Diagnosis not present

## 2014-07-03 DIAGNOSIS — B078 Other viral warts: Secondary | ICD-10-CM | POA: Diagnosis not present

## 2014-07-07 DIAGNOSIS — I10 Essential (primary) hypertension: Secondary | ICD-10-CM | POA: Diagnosis not present

## 2014-07-07 DIAGNOSIS — E1151 Type 2 diabetes mellitus with diabetic peripheral angiopathy without gangrene: Secondary | ICD-10-CM | POA: Diagnosis not present

## 2014-07-07 DIAGNOSIS — Z6832 Body mass index (BMI) 32.0-32.9, adult: Secondary | ICD-10-CM | POA: Diagnosis not present

## 2014-07-07 DIAGNOSIS — I2510 Atherosclerotic heart disease of native coronary artery without angina pectoris: Secondary | ICD-10-CM | POA: Diagnosis not present

## 2014-07-07 DIAGNOSIS — M25511 Pain in right shoulder: Secondary | ICD-10-CM | POA: Diagnosis not present

## 2014-07-08 DIAGNOSIS — Z6832 Body mass index (BMI) 32.0-32.9, adult: Secondary | ICD-10-CM | POA: Diagnosis not present

## 2014-07-08 DIAGNOSIS — M25511 Pain in right shoulder: Secondary | ICD-10-CM | POA: Diagnosis not present

## 2014-07-08 DIAGNOSIS — I10 Essential (primary) hypertension: Secondary | ICD-10-CM | POA: Diagnosis not present

## 2014-07-08 DIAGNOSIS — I2510 Atherosclerotic heart disease of native coronary artery without angina pectoris: Secondary | ICD-10-CM | POA: Diagnosis not present

## 2014-07-08 DIAGNOSIS — E1151 Type 2 diabetes mellitus with diabetic peripheral angiopathy without gangrene: Secondary | ICD-10-CM | POA: Diagnosis not present

## 2014-07-15 DIAGNOSIS — M5090 Cervical disc disorder, unspecified, unspecified cervical region: Secondary | ICD-10-CM | POA: Diagnosis not present

## 2014-07-23 DIAGNOSIS — M5090 Cervical disc disorder, unspecified, unspecified cervical region: Secondary | ICD-10-CM | POA: Diagnosis not present

## 2014-08-20 DIAGNOSIS — M5090 Cervical disc disorder, unspecified, unspecified cervical region: Secondary | ICD-10-CM | POA: Diagnosis not present

## 2014-08-20 DIAGNOSIS — M542 Cervicalgia: Secondary | ICD-10-CM | POA: Diagnosis not present

## 2014-08-22 ENCOUNTER — Ambulatory Visit (INDEPENDENT_AMBULATORY_CARE_PROVIDER_SITE_OTHER): Payer: Commercial Managed Care - HMO | Admitting: Gynecology

## 2014-08-22 ENCOUNTER — Encounter: Payer: Self-pay | Admitting: Gynecology

## 2014-08-22 VITALS — BP 120/76 | Ht 66.0 in | Wt 201.0 lb

## 2014-08-22 DIAGNOSIS — Z01419 Encounter for gynecological examination (general) (routine) without abnormal findings: Secondary | ICD-10-CM

## 2014-08-22 DIAGNOSIS — N7011 Chronic salpingitis: Secondary | ICD-10-CM | POA: Diagnosis not present

## 2014-08-22 DIAGNOSIS — Z79890 Hormone replacement therapy (postmenopausal): Secondary | ICD-10-CM

## 2014-08-22 DIAGNOSIS — N898 Other specified noninflammatory disorders of vagina: Secondary | ICD-10-CM | POA: Diagnosis not present

## 2014-08-22 DIAGNOSIS — N816 Rectocele: Secondary | ICD-10-CM | POA: Diagnosis not present

## 2014-08-22 DIAGNOSIS — N952 Postmenopausal atrophic vaginitis: Secondary | ICD-10-CM

## 2014-08-22 LAB — WET PREP FOR TRICH, YEAST, CLUE
CLUE CELLS WET PREP: NONE SEEN
Trich, Wet Prep: NONE SEEN

## 2014-08-22 MED ORDER — ESTRADIOL 0.05 MG/24HR TD PTWK: 0.0500 mg | MEDICATED_PATCH | TRANSDERMAL | Status: DC

## 2014-08-22 MED ORDER — FLUCONAZOLE 150 MG PO TABS: 150.0000 mg | ORAL_TABLET | Freq: Once | ORAL | Status: DC

## 2014-08-22 NOTE — Progress Notes (Signed)
[name] [birth year] [id number]        74 y.o.  [id number] for breast and pelvic exam. Several issues noted below.  Past medical history,surgical history, problem list, medications, allergies, family history and social history were all reviewed and documented as reviewed in the EPIC chart.  ROS:  Performed with pertinent positives and negatives included in the history, assessment and plan.   Additional significant findings :  Recurrent yeast infections as discussed below   Exam: [name] assistant Filed Vitals:   08/22/14 0939  BP: 120/76  Height: 5\' 6"  (1.676 m)  Weight: 201 lb (91.173 kg)   General appearance:  Normal affect, orientation and appearance. Skin: Grossly normal excepting numerous classic appearing seborrheic keratoses over chest back and abdomen HEENT: Without gross lesions.  No cervical or supraclavicular adenopathy. Thyroid normal.  Lungs:  Clear without wheezing, rales or rhonchi Cardiac: RR, without RMG Abdominal:  Soft, nontender, without masses, guarding, rebound, organomegaly or hernia Breasts:  Examined lying and sitting without masses, retractions, discharge or axillary adenopathy.  Bilateral implants noted. Pelvic:  Ext/BUS/vagina with atrophic changes. First-degree rectocele noted. Cuff well supported. No significant cystocele.  White discharge noted  Adnexa  Without masses or tenderness    Anus and perineum  Normal   Rectovaginal  Normal sphincter tone without palpated masses or tenderness.    Assessment/Plan:  74 y.o. [id number] female for breast and pelvic exam  1. Postmenopausal/HRT. Status post TVH 1973.  Patient continues on her Climara 0.05 mg patch. Has tried to stop with acceptable hot flashes night sweats and just not feeling well. Patient would prefer to continue. I again reviewed the risks to include increased risk of stroke heart attack DVT and breast cancer possibility. Possible increased risk with advancing age also discussed. Patient clearly  understands the risks and feels is a quality of life issue and prefers to continue I refilled her 1 year. 2. Hydrosalpinx.  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 Ultrasound 2005 showed tortuous tubular echo-free cyst measuring 53 x 24 x 23 mm. Note on the report states unchanged for 5 years. Ultrasound 04/2012 measurement 75 x 67 x 32 mm.  Follow up ultrasound 10/2013 measurement 68 x 29 x 28 mm. Patient elects for no further screening as this has remained stable for years.  Disclaimer cannot prove it is not cancer reviewed and she is comfortable with no further screening. 3. Rectocele.  Mild and stable over years observation. A symptomatically the patient. Continue to monitor. 4. Recurrent vaginal infections.  Particularly since starting Invokana.  Wet prep is positive for yeast. She has been using OTC products intermittently. Diflucan 150 mg #5 with 1 refill. Patient will use when necessary throughout the summer. Need to hold cholesterol medicine with each dose discussed.  Suppressive regimens to include boric acid suppositories also discussed. 5. Mammography 05/2014.  Continue with annual mammography.  SBE monthly reviewed. 6. Pap smear 2012.  No Pap smear done today. No history of significant abnormal Pap smears. Patient prefers to stop screening. She is over the age of 71 and status post hysterectomy for benign indications. 7. Colonoscopy.  2012. Repeat at their recommended interval. 8. Health maintenance.  No routine blood work done as patient has this done at her primary physician's office. Follow up 1 year, sooner as needed.   [name] MD, 10:01 AM 08/22/2014

## 2014-08-23 LAB — URINALYSIS W MICROSCOPIC + REFLEX CULTURE
BACTERIA UA: NONE SEEN [HPF]
Bilirubin Urine: NEGATIVE
Casts: NONE SEEN [LPF]
Crystals: NONE SEEN [HPF]
Ketones, ur: NEGATIVE
Nitrite: NEGATIVE
Protein, ur: NEGATIVE
Specific Gravity, Urine: 1.031 (ref 1.001–1.035)
pH: 5 (ref 5.0–8.0)

## 2014-08-24 LAB — URINE CULTURE: Colony Count: 75000

## 2014-08-29 ENCOUNTER — Encounter: Payer: Self-pay | Admitting: Cardiology

## 2014-08-29 ENCOUNTER — Ambulatory Visit (INDEPENDENT_AMBULATORY_CARE_PROVIDER_SITE_OTHER): Payer: Commercial Managed Care - HMO | Admitting: Cardiology

## 2014-08-29 VITALS — BP 132/80 | HR 72 | Ht 66.0 in | Wt 200.4 lb

## 2014-08-29 DIAGNOSIS — I10 Essential (primary) hypertension: Secondary | ICD-10-CM | POA: Diagnosis not present

## 2014-08-29 DIAGNOSIS — I2510 Atherosclerotic heart disease of native coronary artery without angina pectoris: Secondary | ICD-10-CM

## 2014-08-29 DIAGNOSIS — E785 Hyperlipidemia, unspecified: Secondary | ICD-10-CM | POA: Diagnosis not present

## 2014-08-29 MED ORDER — CARVEDILOL 12.5 MG PO TABS: 12.5000 mg | ORAL_TABLET | Freq: Two times a day (BID) | ORAL | Status: DC

## 2014-08-29 NOTE — Progress Notes (Signed)
[name] Date of Birth: [birth year] Medical Record #[id number]  History of Present Illness: [name] is seen for follow up CAD. She has known CAD with past Taxus stent to the LAD in 2008, HTN, HLD, IDDM and GERD.In September 2014 she had an abnormal Myoview study. Cardiac cath showed nonobstructive CAD.  On follow up today she is doing well.   No chest pain or SOB. She has lost some weight this year.  Still is very sedentary. She reports she is having lab work done next week with Dr. Osborne Casco. She does complain about the cost of her medication.  Current Outpatient Prescriptions  Medication Sig Dispense Refill  . aspirin 81 MG tablet Take 81 mg by mouth daily.    . Canagliflozin (INVOKANA) 300 MG TABS Take by mouth.    . clopidogrel (PLAVIX) 75 MG tablet take 1 tablet by mouth once daily 30 tablet 5  . diazepam (VALIUM) 5 MG tablet Take 1 tablet by mouth daily as needed. Take 1 tab daily as needed  0  . estradiol (CLIMARA - DOSED IN MG/24 HR) 0.05 mg/24hr patch Place 1 patch (0.05 mg total) onto the skin once a week. 4 patch 12  . fluconazole (DIFLUCAN) 150 MG tablet Take 1 tablet (150 mg total) by mouth once. As needed for yeast.  Hold cholesterol medication the day you take this medication 5 tablet 1  . HYDROcodone-acetaminophen (NORCO/VICODIN) 5-325 MG per tablet Take 1 tablet by mouth as needed.     Marland Kitchen MAGNESIUM PO Take by mouth.    . METFORMIN HCL PO Take 500 mg by mouth 2 (two) times daily.      Marland Kitchen omeprazole (PRILOSEC) 20 MG capsule Take 20 mg by mouth daily.      [name] VERIO test strip 1 strip by Other route daily. Use 1 strip to check glucose once a day  0  . ramipril (ALTACE) 5 MG tablet Take 5 mg by mouth daily.      . rosuvastatin (CRESTOR) 10 MG tablet Take 10 mg by mouth daily.      [name] SOLOSTAR 300 UNIT/ML SOPN Inject 72 Units as directed daily.  0  . carvedilol (COREG) 12.5 MG tablet Take 1 tablet (12.5 mg total) by mouth 2 (two) times daily. 180 tablet 3     Current Facility-Administered Medications  Medication Dose Route Frequency Provider Last Rate Last Dose  . 0.9 %  sodium chloride infusion  500 mL Intravenous Continuous Janet Shipper, MD        Allergies  Allergen Reactions  . Erythromycin Other (See Comments)    Mouth ulcers    Past Medical History  Diagnosis Date  . Hypertension   . GERD (gastroesophageal reflux disease)   . Hyperlipidemia   . Depression   . Diabetes mellitus   . OA (osteoarthritis)   . CAD (coronary artery disease)   . Chronic cough   . Cataract   . History of heart artery stent   . Endometriosis   . Obesity     Past Surgical History  Procedure Laterality Date  . Appendectomy    . Cholecystectomy    . Angioplasty    . Heart stent    . Tonsillectomy    . Cardiac catheterization  07/17/2006  . Cardiovascular stress test  03/31/2009    EF 72%  . Vaginal hysterectomy  1973  . Left heart catheterization with coronary angiogram N/A 10/09/2012    Procedure: LEFT HEART CATHETERIZATION WITH  CORONARY ANGIOGRAM;  Surgeon: Janet M Martinique, MD;  Location: [facility] CATH LAB;  Service: Cardiovascular;  Laterality: N/A;  . Breast enhancement surgery  age 68    History  Smoking status  . Never Smoker   Smokeless tobacco  . Never Used    History  Alcohol Use  . 0.0 oz/week  . 0 Standard drinks or equivalent per week    Comment: Rare    Family History  Problem Relation Age of Onset  . Lung cancer Father   . Diabetes Maternal Grandmother   . Hypertension Maternal Grandmother   . Alcohol abuse Mother     Review of Systems: The review of systems is per the HPI.  All other systems were reviewed and are negative.  Physical Exam: BP 132/80 mmHg  Pulse 72  Ht 5\' 6"  (1.676 m)  Wt 90.918 kg (200 lb 7 oz)  BMI 32.37 kg/m2 Patient is very pleasant and in no acute distress. She is obese. Skin is warm and dry. Color is normal.  HEENT is unremarkable. Normocephalic/atraumatic. PERRL. Sclera are nonicteric. Neck  is supple. No masses. No JVD. Lungs are clear. Cardiac exam shows a regular rate and rhythm. Abdomen is soft. Extremities are without edema. Gait and ROM are intact. No gross neurologic deficits noted.  LABORATORY DATA:     Assessment / Plan: 1. Known CAD with past stenting of the LAD 2008 - Cardiac cath Sept. 2014 showed nonobstructive CAD. Continue medical Rx. Encourage increase aerobic activity.  2. HTN - controlled.   3. HLD - labs followed by primary care. Continue Crestor.  4. IDDM   We will switch Bystolic to carvedilol 76.1 mg bid to help with drug costs. Her other cardiac meds are generic. Follow up in 6 months.

## 2014-08-29 NOTE — Patient Instructions (Signed)
We will switch Bystolic to carvedilol 68.8 mg twice a day  Continue your other therapy  I will see you in 6 months.

## 2014-09-08 DIAGNOSIS — E785 Hyperlipidemia, unspecified: Secondary | ICD-10-CM | POA: Diagnosis not present

## 2014-09-08 DIAGNOSIS — I2510 Atherosclerotic heart disease of native coronary artery without angina pectoris: Secondary | ICD-10-CM | POA: Diagnosis not present

## 2014-09-08 DIAGNOSIS — F331 Major depressive disorder, recurrent, moderate: Secondary | ICD-10-CM | POA: Diagnosis not present

## 2014-09-08 DIAGNOSIS — E1151 Type 2 diabetes mellitus with diabetic peripheral angiopathy without gangrene: Secondary | ICD-10-CM | POA: Diagnosis not present

## 2014-09-08 DIAGNOSIS — Z5181 Encounter for therapeutic drug level monitoring: Secondary | ICD-10-CM | POA: Diagnosis not present

## 2014-09-08 DIAGNOSIS — E669 Obesity, unspecified: Secondary | ICD-10-CM | POA: Diagnosis not present

## 2014-09-08 DIAGNOSIS — Z1389 Encounter for screening for other disorder: Secondary | ICD-10-CM | POA: Diagnosis not present

## 2014-09-08 DIAGNOSIS — I10 Essential (primary) hypertension: Secondary | ICD-10-CM | POA: Diagnosis not present

## 2015-01-20 DIAGNOSIS — M25511 Pain in right shoulder: Secondary | ICD-10-CM | POA: Diagnosis not present

## 2015-01-20 DIAGNOSIS — M542 Cervicalgia: Secondary | ICD-10-CM | POA: Diagnosis not present

## 2015-02-12 DIAGNOSIS — M5090 Cervical disc disorder, unspecified, unspecified cervical region: Secondary | ICD-10-CM | POA: Diagnosis not present

## 2015-02-12 DIAGNOSIS — M542 Cervicalgia: Secondary | ICD-10-CM | POA: Diagnosis not present

## 2015-02-20 DIAGNOSIS — M50322 Other cervical disc degeneration at C5-C6 level: Secondary | ICD-10-CM | POA: Diagnosis not present

## 2015-02-20 DIAGNOSIS — M5031 Other cervical disc degeneration,  high cervical region: Secondary | ICD-10-CM | POA: Diagnosis not present

## 2015-02-20 DIAGNOSIS — M50323 Other cervical disc degeneration at C6-C7 level: Secondary | ICD-10-CM | POA: Diagnosis not present

## 2015-02-26 DIAGNOSIS — M50323 Other cervical disc degeneration at C6-C7 level: Secondary | ICD-10-CM | POA: Diagnosis not present

## 2015-02-26 DIAGNOSIS — M50322 Other cervical disc degeneration at C5-C6 level: Secondary | ICD-10-CM | POA: Diagnosis not present

## 2015-02-26 DIAGNOSIS — M50321 Other cervical disc degeneration at C4-C5 level: Secondary | ICD-10-CM | POA: Diagnosis not present

## 2015-03-17 DIAGNOSIS — E7800 Pure hypercholesterolemia, unspecified: Secondary | ICD-10-CM | POA: Diagnosis not present

## 2015-03-17 DIAGNOSIS — F331 Major depressive disorder, recurrent, moderate: Secondary | ICD-10-CM | POA: Diagnosis not present

## 2015-03-17 DIAGNOSIS — Z79890 Hormone replacement therapy (postmenopausal): Secondary | ICD-10-CM | POA: Diagnosis not present

## 2015-03-17 DIAGNOSIS — Z794 Long term (current) use of insulin: Secondary | ICD-10-CM | POA: Diagnosis not present

## 2015-03-17 DIAGNOSIS — E1151 Type 2 diabetes mellitus with diabetic peripheral angiopathy without gangrene: Secondary | ICD-10-CM | POA: Diagnosis not present

## 2015-03-17 DIAGNOSIS — E668 Other obesity: Secondary | ICD-10-CM | POA: Diagnosis not present

## 2015-03-17 DIAGNOSIS — I10 Essential (primary) hypertension: Secondary | ICD-10-CM | POA: Diagnosis not present

## 2015-03-17 DIAGNOSIS — D692 Other nonthrombocytopenic purpura: Secondary | ICD-10-CM | POA: Diagnosis not present

## 2015-03-17 DIAGNOSIS — I119 Hypertensive heart disease without heart failure: Secondary | ICD-10-CM | POA: Diagnosis not present

## 2015-03-24 DIAGNOSIS — M50321 Other cervical disc degeneration at C4-C5 level: Secondary | ICD-10-CM | POA: Diagnosis not present

## 2015-04-08 DIAGNOSIS — M50322 Other cervical disc degeneration at C5-C6 level: Secondary | ICD-10-CM | POA: Diagnosis not present

## 2015-04-08 DIAGNOSIS — M50323 Other cervical disc degeneration at C6-C7 level: Secondary | ICD-10-CM | POA: Diagnosis not present

## 2015-04-08 DIAGNOSIS — M5031 Other cervical disc degeneration,  high cervical region: Secondary | ICD-10-CM | POA: Diagnosis not present

## 2015-04-08 DIAGNOSIS — M50321 Other cervical disc degeneration at C4-C5 level: Secondary | ICD-10-CM | POA: Diagnosis not present

## 2015-04-10 ENCOUNTER — Encounter: Payer: Self-pay | Admitting: Internal Medicine

## 2015-05-18 DIAGNOSIS — H25012 Cortical age-related cataract, left eye: Secondary | ICD-10-CM | POA: Diagnosis not present

## 2015-05-18 DIAGNOSIS — H2512 Age-related nuclear cataract, left eye: Secondary | ICD-10-CM | POA: Diagnosis not present

## 2015-05-18 DIAGNOSIS — H5212 Myopia, left eye: Secondary | ICD-10-CM | POA: Diagnosis not present

## 2015-05-18 DIAGNOSIS — E119 Type 2 diabetes mellitus without complications: Secondary | ICD-10-CM | POA: Diagnosis not present

## 2015-08-24 ENCOUNTER — Ambulatory Visit (INDEPENDENT_AMBULATORY_CARE_PROVIDER_SITE_OTHER): Payer: Commercial Managed Care - HMO | Admitting: Gynecology

## 2015-08-24 ENCOUNTER — Encounter: Payer: Self-pay | Admitting: Gynecology

## 2015-08-24 VITALS — BP 116/74 | Ht 65.5 in | Wt 201.0 lb

## 2015-08-24 DIAGNOSIS — N952 Postmenopausal atrophic vaginitis: Secondary | ICD-10-CM | POA: Diagnosis not present

## 2015-08-24 DIAGNOSIS — Z9229 Personal history of other drug therapy: Secondary | ICD-10-CM

## 2015-08-24 DIAGNOSIS — N816 Rectocele: Secondary | ICD-10-CM | POA: Diagnosis not present

## 2015-08-24 DIAGNOSIS — N7011 Chronic salpingitis: Secondary | ICD-10-CM

## 2015-08-24 DIAGNOSIS — Z01419 Encounter for gynecological examination (general) (routine) without abnormal findings: Secondary | ICD-10-CM

## 2015-08-24 MED ORDER — ESTRADIOL 0.05 MG/24HR TD PTWK: 0.0500 mg | MEDICATED_PATCH | TRANSDERMAL | 12 refills | Status: DC

## 2015-08-24 NOTE — Patient Instructions (Signed)

## 2015-08-24 NOTE — Progress Notes (Addendum)
    [name] Mar 05, 1940 [id number]        75 y.o.  [id number]  for breast and pelvic exam. Several issues noted below.  Past medical history,surgical history, problem list, medications, allergies, family history and social history were all reviewed and documented as reviewed in the EPIC chart.  ROS:  Performed with pertinent positives and negatives included in the history, assessment and plan.   Additional significant findings :  None   Exam: [name] assistant Vitals:   08/24/15 1148  BP: 116/74  Weight: 201 lb (91.2 kg)  Height: 5' 5.5" (1.664 m)   General appearance:  Normal affect, orientation and appearance. Skin: Grossly normal HEENT: Without gross lesions.  No cervical or supraclavicular adenopathy. Thyroid normal.  Lungs:  Clear without wheezing, rales or rhonchi Cardiac: RR, without RMG Abdominal:  Soft, nontender, without masses, guarding, rebound, organomegaly or hernia Breasts:  Examined lying and sitting without masses, retractions, discharge or axillary adenopathy.   Bilateral implants noted Pelvic:  Ext/BUS/Vagina with atrophic changes. First-degree rectocele noted. Cuff well supported. No significant cystocele  Adnexa without masses or tenderness    Anus and perineum normal   Rectovaginal normal sphincter tone without palpated masses or tenderness.    Assessment/Plan:  75 y.o. [id number] female for . breast and pelvic exam.  1. Postmenopausal/HRT.  Continues on Climara 0.05 mg patch. Status post TVH 1973. Has tried to stop it has unacceptable night sweats. I reviewed the new 2017 [employer] guidelines on HRT. I reviewed the risks versus benefits to include increased risk of thrombosis such as stroke heart attack DVT and possible breast cancer. Possible cardiovascular benefits as well as bone health when started early also discussed. A shared patient/physician decision process reviewed. Patient feels from a quality-of-life standpoint she wants to continue,  understands and accepts the risks and was refill 1 year. 2. Hydrosalpinx. Has been followed for persistent stable left presumed hydrosalpinx. Last measurement 10/2013 68 x 29 x 28 mm. We have discussed this in the past and again I discussed today options to include no further monitoring and she is asymptomatic and has been stable for years versus continued ultrasounds up to including surgery. Patient not interested in doing anything further at this point and prefers just to monitor symptomatically. Exam today is normal without palpable abnormalities. 3. Mild rectocele. Asymptomatic to the patient. Stable over years observation. Continue to follow expectantly with any reported symptoms. 4. Pap smear 2012. No Pap smear done today. We both agree to stop screening based on no history of significant abnormal Pap smears, age and hysterectomy history per current screening guidelines. 5. Colonoscopy 2012. Repeat at their recommended interval. 6. Mammography 05/2014. Need to schedule mammography urged. SBE monthly reviewed. 7. Health maintenance. No routine lab work done as patient reports is done elsewhere. Follow up 1 year, sooner as needed.  Greater than 15 minutes of my time in excess of her breast and pelvic exam was spent in direct face to face counseling and coordination of care in regards to her problems of new HRT guidelines, hydrosalpinx, rectocele.  [name] MD, 12:12 PM 08/24/2015

## 2015-09-03 DIAGNOSIS — D2261 Melanocytic nevi of right upper limb, including shoulder: Secondary | ICD-10-CM | POA: Diagnosis not present

## 2015-09-03 DIAGNOSIS — D485 Neoplasm of uncertain behavior of skin: Secondary | ICD-10-CM | POA: Diagnosis not present

## 2015-09-03 DIAGNOSIS — L821 Other seborrheic keratosis: Secondary | ICD-10-CM | POA: Diagnosis not present

## 2015-09-03 DIAGNOSIS — D225 Melanocytic nevi of trunk: Secondary | ICD-10-CM | POA: Diagnosis not present

## 2015-09-03 DIAGNOSIS — L918 Other hypertrophic disorders of the skin: Secondary | ICD-10-CM | POA: Diagnosis not present

## 2015-09-03 DIAGNOSIS — D1801 Hemangioma of skin and subcutaneous tissue: Secondary | ICD-10-CM | POA: Diagnosis not present

## 2015-09-03 DIAGNOSIS — L820 Inflamed seborrheic keratosis: Secondary | ICD-10-CM | POA: Diagnosis not present

## 2015-09-03 DIAGNOSIS — B078 Other viral warts: Secondary | ICD-10-CM | POA: Diagnosis not present

## 2015-09-10 DIAGNOSIS — M50321 Other cervical disc degeneration at C4-C5 level: Secondary | ICD-10-CM | POA: Diagnosis not present

## 2015-09-10 DIAGNOSIS — M50322 Other cervical disc degeneration at C5-C6 level: Secondary | ICD-10-CM | POA: Diagnosis not present

## 2015-09-10 DIAGNOSIS — M50323 Other cervical disc degeneration at C6-C7 level: Secondary | ICD-10-CM | POA: Diagnosis not present

## 2015-09-10 DIAGNOSIS — M542 Cervicalgia: Secondary | ICD-10-CM | POA: Diagnosis not present

## 2015-09-11 DIAGNOSIS — I10 Essential (primary) hypertension: Secondary | ICD-10-CM | POA: Diagnosis not present

## 2015-09-11 DIAGNOSIS — E1151 Type 2 diabetes mellitus with diabetic peripheral angiopathy without gangrene: Secondary | ICD-10-CM | POA: Diagnosis not present

## 2015-09-18 DIAGNOSIS — D692 Other nonthrombocytopenic purpura: Secondary | ICD-10-CM | POA: Diagnosis not present

## 2015-09-18 DIAGNOSIS — Z6832 Body mass index (BMI) 32.0-32.9, adult: Secondary | ICD-10-CM | POA: Diagnosis not present

## 2015-09-18 DIAGNOSIS — F331 Major depressive disorder, recurrent, moderate: Secondary | ICD-10-CM | POA: Diagnosis not present

## 2015-09-18 DIAGNOSIS — E7800 Pure hypercholesterolemia, unspecified: Secondary | ICD-10-CM | POA: Diagnosis not present

## 2015-09-18 DIAGNOSIS — Z0000 Encounter for general adult medical examination without abnormal findings: Secondary | ICD-10-CM | POA: Diagnosis not present

## 2015-09-18 DIAGNOSIS — E1151 Type 2 diabetes mellitus with diabetic peripheral angiopathy without gangrene: Secondary | ICD-10-CM | POA: Diagnosis not present

## 2015-09-18 DIAGNOSIS — I119 Hypertensive heart disease without heart failure: Secondary | ICD-10-CM | POA: Diagnosis not present

## 2015-09-18 DIAGNOSIS — I10 Essential (primary) hypertension: Secondary | ICD-10-CM | POA: Diagnosis not present

## 2015-09-18 DIAGNOSIS — E668 Other obesity: Secondary | ICD-10-CM | POA: Diagnosis not present

## 2015-09-23 DIAGNOSIS — M5136 Other intervertebral disc degeneration, lumbar region: Secondary | ICD-10-CM | POA: Diagnosis not present

## 2015-10-27 ENCOUNTER — Other Ambulatory Visit: Payer: Self-pay | Admitting: Cardiology

## 2015-10-27 NOTE — Telephone Encounter (Signed)
Rx request sent to pharmacy.  

## 2016-01-16 ENCOUNTER — Other Ambulatory Visit: Payer: Self-pay | Admitting: Cardiology

## 2016-01-19 NOTE — Telephone Encounter (Signed)
REFILL 

## 2016-01-25 HISTORY — PX: CATARACT EXTRACTION: SUR2

## 2016-03-23 DIAGNOSIS — F331 Major depressive disorder, recurrent, moderate: Secondary | ICD-10-CM | POA: Diagnosis not present

## 2016-03-23 DIAGNOSIS — E7800 Pure hypercholesterolemia, unspecified: Secondary | ICD-10-CM | POA: Diagnosis not present

## 2016-03-23 DIAGNOSIS — E1151 Type 2 diabetes mellitus with diabetic peripheral angiopathy without gangrene: Secondary | ICD-10-CM | POA: Diagnosis not present

## 2016-03-23 DIAGNOSIS — I2510 Atherosclerotic heart disease of native coronary artery without angina pectoris: Secondary | ICD-10-CM | POA: Diagnosis not present

## 2016-03-23 DIAGNOSIS — Z794 Long term (current) use of insulin: Secondary | ICD-10-CM | POA: Diagnosis not present

## 2016-03-23 DIAGNOSIS — I10 Essential (primary) hypertension: Secondary | ICD-10-CM | POA: Diagnosis not present

## 2016-03-23 DIAGNOSIS — D692 Other nonthrombocytopenic purpura: Secondary | ICD-10-CM | POA: Diagnosis not present

## 2016-03-23 DIAGNOSIS — E668 Other obesity: Secondary | ICD-10-CM | POA: Diagnosis not present

## 2016-03-23 DIAGNOSIS — I119 Hypertensive heart disease without heart failure: Secondary | ICD-10-CM | POA: Diagnosis not present

## 2016-05-03 ENCOUNTER — Other Ambulatory Visit: Payer: Self-pay | Admitting: Cardiology

## 2016-05-03 NOTE — Telephone Encounter (Signed)
REFILL 

## 2016-06-22 DIAGNOSIS — M47812 Spondylosis without myelopathy or radiculopathy, cervical region: Secondary | ICD-10-CM | POA: Diagnosis not present

## 2016-07-11 DIAGNOSIS — M5136 Other intervertebral disc degeneration, lumbar region: Secondary | ICD-10-CM | POA: Diagnosis not present

## 2016-07-11 DIAGNOSIS — M50321 Other cervical disc degeneration at C4-C5 level: Secondary | ICD-10-CM | POA: Diagnosis not present

## 2016-07-11 DIAGNOSIS — M47812 Spondylosis without myelopathy or radiculopathy, cervical region: Secondary | ICD-10-CM | POA: Diagnosis not present

## 2016-07-11 DIAGNOSIS — M50323 Other cervical disc degeneration at C6-C7 level: Secondary | ICD-10-CM | POA: Diagnosis not present

## 2016-08-11 ENCOUNTER — Other Ambulatory Visit: Payer: Self-pay | Admitting: Cardiology

## 2016-08-12 DIAGNOSIS — H04123 Dry eye syndrome of bilateral lacrimal glands: Secondary | ICD-10-CM | POA: Diagnosis not present

## 2016-08-12 DIAGNOSIS — H5212 Myopia, left eye: Secondary | ICD-10-CM | POA: Diagnosis not present

## 2016-08-12 DIAGNOSIS — H2512 Age-related nuclear cataract, left eye: Secondary | ICD-10-CM | POA: Diagnosis not present

## 2016-08-12 DIAGNOSIS — E119 Type 2 diabetes mellitus without complications: Secondary | ICD-10-CM | POA: Diagnosis not present

## 2016-09-09 DIAGNOSIS — R5382 Chronic fatigue, unspecified: Secondary | ICD-10-CM | POA: Diagnosis not present

## 2016-09-09 DIAGNOSIS — M79641 Pain in right hand: Secondary | ICD-10-CM | POA: Diagnosis not present

## 2016-09-09 DIAGNOSIS — M2550 Pain in unspecified joint: Secondary | ICD-10-CM | POA: Diagnosis not present

## 2016-09-09 DIAGNOSIS — M79642 Pain in left hand: Secondary | ICD-10-CM | POA: Diagnosis not present

## 2016-09-09 DIAGNOSIS — E669 Obesity, unspecified: Secondary | ICD-10-CM | POA: Diagnosis not present

## 2016-09-09 DIAGNOSIS — Z6832 Body mass index (BMI) 32.0-32.9, adult: Secondary | ICD-10-CM | POA: Diagnosis not present

## 2016-09-12 DIAGNOSIS — M5136 Other intervertebral disc degeneration, lumbar region: Secondary | ICD-10-CM | POA: Diagnosis not present

## 2016-09-12 DIAGNOSIS — M50321 Other cervical disc degeneration at C4-C5 level: Secondary | ICD-10-CM | POA: Diagnosis not present

## 2016-09-12 DIAGNOSIS — M50322 Other cervical disc degeneration at C5-C6 level: Secondary | ICD-10-CM | POA: Diagnosis not present

## 2016-09-12 DIAGNOSIS — M50323 Other cervical disc degeneration at C6-C7 level: Secondary | ICD-10-CM | POA: Diagnosis not present

## 2016-09-13 DIAGNOSIS — R8299 Other abnormal findings in urine: Secondary | ICD-10-CM | POA: Diagnosis not present

## 2016-09-13 DIAGNOSIS — E1151 Type 2 diabetes mellitus with diabetic peripheral angiopathy without gangrene: Secondary | ICD-10-CM | POA: Diagnosis not present

## 2016-09-13 DIAGNOSIS — E7800 Pure hypercholesterolemia, unspecified: Secondary | ICD-10-CM | POA: Diagnosis not present

## 2016-09-13 DIAGNOSIS — N390 Urinary tract infection, site not specified: Secondary | ICD-10-CM | POA: Diagnosis not present

## 2016-09-21 DIAGNOSIS — I2510 Atherosclerotic heart disease of native coronary artery without angina pectoris: Secondary | ICD-10-CM | POA: Diagnosis not present

## 2016-09-21 DIAGNOSIS — E7800 Pure hypercholesterolemia, unspecified: Secondary | ICD-10-CM | POA: Diagnosis not present

## 2016-09-21 DIAGNOSIS — Z79890 Hormone replacement therapy (postmenopausal): Secondary | ICD-10-CM | POA: Diagnosis not present

## 2016-09-21 DIAGNOSIS — Z0000 Encounter for general adult medical examination without abnormal findings: Secondary | ICD-10-CM | POA: Diagnosis not present

## 2016-09-21 DIAGNOSIS — D692 Other nonthrombocytopenic purpura: Secondary | ICD-10-CM | POA: Diagnosis not present

## 2016-09-21 DIAGNOSIS — F325 Major depressive disorder, single episode, in full remission: Secondary | ICD-10-CM | POA: Diagnosis not present

## 2016-09-21 DIAGNOSIS — Z794 Long term (current) use of insulin: Secondary | ICD-10-CM | POA: Diagnosis not present

## 2016-09-21 DIAGNOSIS — I119 Hypertensive heart disease without heart failure: Secondary | ICD-10-CM | POA: Diagnosis not present

## 2016-09-21 DIAGNOSIS — E1151 Type 2 diabetes mellitus with diabetic peripheral angiopathy without gangrene: Secondary | ICD-10-CM | POA: Diagnosis not present

## 2016-09-21 DIAGNOSIS — Z23 Encounter for immunization: Secondary | ICD-10-CM | POA: Diagnosis not present

## 2016-09-22 DIAGNOSIS — H25012 Cortical age-related cataract, left eye: Secondary | ICD-10-CM | POA: Diagnosis not present

## 2016-09-22 DIAGNOSIS — H25042 Posterior subcapsular polar age-related cataract, left eye: Secondary | ICD-10-CM | POA: Diagnosis not present

## 2016-09-22 DIAGNOSIS — H2512 Age-related nuclear cataract, left eye: Secondary | ICD-10-CM | POA: Diagnosis not present

## 2016-09-22 DIAGNOSIS — H25812 Combined forms of age-related cataract, left eye: Secondary | ICD-10-CM | POA: Diagnosis not present

## 2016-09-23 DIAGNOSIS — M79641 Pain in right hand: Secondary | ICD-10-CM | POA: Diagnosis not present

## 2016-09-23 DIAGNOSIS — E669 Obesity, unspecified: Secondary | ICD-10-CM | POA: Diagnosis not present

## 2016-09-23 DIAGNOSIS — Z6832 Body mass index (BMI) 32.0-32.9, adult: Secondary | ICD-10-CM | POA: Diagnosis not present

## 2016-09-23 DIAGNOSIS — M0609 Rheumatoid arthritis without rheumatoid factor, multiple sites: Secondary | ICD-10-CM | POA: Diagnosis not present

## 2016-09-23 DIAGNOSIS — M79642 Pain in left hand: Secondary | ICD-10-CM | POA: Diagnosis not present

## 2016-09-23 DIAGNOSIS — M2550 Pain in unspecified joint: Secondary | ICD-10-CM | POA: Diagnosis not present

## 2016-09-23 DIAGNOSIS — R5382 Chronic fatigue, unspecified: Secondary | ICD-10-CM | POA: Diagnosis not present

## 2016-09-27 DIAGNOSIS — Z1231 Encounter for screening mammogram for malignant neoplasm of breast: Secondary | ICD-10-CM | POA: Diagnosis not present

## 2016-10-06 ENCOUNTER — Encounter: Payer: Self-pay | Admitting: Gynecology

## 2016-10-06 ENCOUNTER — Ambulatory Visit (INDEPENDENT_AMBULATORY_CARE_PROVIDER_SITE_OTHER): Payer: Medicare HMO | Admitting: Gynecology

## 2016-10-06 VITALS — BP 120/80 | Ht 65.0 in | Wt 199.0 lb

## 2016-10-06 DIAGNOSIS — Z01411 Encounter for gynecological examination (general) (routine) with abnormal findings: Secondary | ICD-10-CM

## 2016-10-06 DIAGNOSIS — N760 Acute vaginitis: Secondary | ICD-10-CM | POA: Diagnosis not present

## 2016-10-06 DIAGNOSIS — N816 Rectocele: Secondary | ICD-10-CM | POA: Diagnosis not present

## 2016-10-06 DIAGNOSIS — N951 Menopausal and female climacteric states: Secondary | ICD-10-CM | POA: Diagnosis not present

## 2016-10-06 LAB — WET PREP FOR TRICH, YEAST, CLUE

## 2016-10-06 MED ORDER — ESTRADIOL 0.05 MG/24HR TD PTTW: 1.0000 | MEDICATED_PATCH | TRANSDERMAL | 12 refills | Status: DC

## 2016-10-06 MED ORDER — TERCONAZOLE 0.4 % VA CREA: 1.0000 | TOPICAL_CREAM | Freq: Every day | VAGINAL | 1 refills | Status: DC

## 2016-10-06 NOTE — Progress Notes (Signed)
[name] December 12, 1940 [id number]        76 y.o.  [id number] for breast and pelvic exam. Patient wants to discuss 2 issues:  1. Menopausal symptoms. Patient previously was on estradiol patch but discontinued last year. Has developed unacceptable hot flushes and sweats. Keeping her up at night. No weight gain or weight loss. No hair or skin changes. 2. Vaginal irritation with intense itching. No significant discharge or odor. No urinary symptoms such as frequency dysuria or urgency low back pain fever or chills. No nausea vomiting diarrhea constipation.  Past medical history,surgical history, problem list, medications, allergies, family history and social history were all reviewed and documented as reviewed in the EPIC chart.  ROS:  Performed with pertinent positives and negatives included in the history, assessment and plan.   Additional significant findings :  None   Exam: [name] assistant Vitals:   10/06/16 1154  BP: 120/80  Weight: 199 lb (90.3 kg)  Height: 5\' 5"  (1.651 m)   Body mass index is 33.12 kg/m.  General appearance:  Normal affect, orientation and appearance. Skin: Grossly normal HEENT: Without gross lesions.  No cervical or supraclavicular adenopathy. Thyroid normal.  Lungs:  Clear without wheezing, rales or rhonchi Cardiac: RR, without RMG Abdominal:  Soft, nontender, without masses, guarding, rebound, organomegaly or hernia Breasts:  Examined lying and sitting without masses, retractions, discharge or axillary adenopathy.  Bilateral implants noted Pelvic:  Ext, BUS, Vagina: With atrophic changes. Intense generalized vulvitis on the inner aspects of her labia minora and vagina. Scant cakey white discharge. Mild rectocele noted bladder well supported. No enterocele.  Adnexa: Without masses or tenderness    Anus and perineum: Normal   Rectovaginal: Normal sphincter tone without palpated masses or tenderness.    Assessment/Plan:  76 y.o. [id number] female  for breast and pelvic exam.   1. Postmenopausal/menopausal symptoms.  Status post TVH 1973. Had previously been on estradiol 0.05 mg patches previously. Stop last year with unacceptable hot flushes and sweats now. Tried OTC products without benefit. Discussed pharmacologic nonhormonal such as Effexor as well as reinitiating HRT. Risks to include stroke heart attack DVT as well as the breast cancer issue all discussed. Benefits from a symptom relief and possible cardiovascular and bone health when started early also discussed. At this point the patient finds this is a quality of life issue and wants to reinitiate. Estradiol 0.05 mg patches 1 year prescribed. Follow up if any issues.  2. Vaginitis. Intense vulvitis/vaginitis on exam. Wet prep was negative but clinically clearly appears his yeast. Will treat with Terazol 7 day cream. One refill provided. Will follow up if this does not resolve her symptoms.  3. Hydrosalpinx. Long history of left presumed hydrosalpinx followed with serial ultrasounds stable on exams. Patient without pain or other symptoms attributable to this. Last measurement 10/2013 68 x 29 x 28 mm. We rediscussed this issue as far as screening versus stop screening. We both agree to stop screening and to go back and take a look if she develops symptoms and she is comfortable with this decision.  4. Rectocele. Mild and stable over serial exams. Asymptomatic to the patient. Continue to monitor. 5. Pap smear 2012. No Pap smear done today. We both agree to stop screening per current screening guidelines based on no history of abnormal Pap smears, age and hysterectomy history.  6. Mammography last week. Continue with annual mammography next year. Breast exam normal today. 7. DEXA 2014 normal. Plan repeat DEXA next  year at 5 year interval. Increase calcium vitamin D. 8. Colonoscopy 2012. Reports receiving a letter last year about scheduling. I encouraged her to call them and make arrangements for  this and she agrees to do so. 9. Health maintenance. No routine lab work done as patient reports this done elsewhere. Follow up 1 year, sooner as needed.  Additional 15 minute time in excess of her breast and pelvic exam was spent in direct face to face counseling and coordination of care in regards to her menopausal symptoms with treatment options reviewed and ultimate prescription provided as well as her vaginitis evaluation and treatment and hydrosalpinx discussion.    [name] MD, 12:15 PM 10/06/2016

## 2016-10-06 NOTE — Addendum Note (Signed)
Addended by: Burnett Kanaris on: 10/06/2016 03:24 PM   Modules accepted: Orders

## 2016-10-06 NOTE — Patient Instructions (Signed)
Start back on the estrogen patches. Call me if you have any issues with this.

## 2016-10-10 ENCOUNTER — Telehealth: Payer: Self-pay | Admitting: *Deleted

## 2016-10-10 NOTE — Telephone Encounter (Signed)
Prior authorization done via cover my meds for climara patch 0.05 mg, will wait for response.

## 2016-10-18 DIAGNOSIS — M47812 Spondylosis without myelopathy or radiculopathy, cervical region: Secondary | ICD-10-CM | POA: Diagnosis not present

## 2016-10-18 NOTE — Telephone Encounter (Signed)
Medication approved until 01/23/17, pharmacy informed as well.

## 2016-11-04 DIAGNOSIS — L814 Other melanin hyperpigmentation: Secondary | ICD-10-CM | POA: Diagnosis not present

## 2016-11-04 DIAGNOSIS — D1801 Hemangioma of skin and subcutaneous tissue: Secondary | ICD-10-CM | POA: Diagnosis not present

## 2016-11-04 DIAGNOSIS — L720 Epidermal cyst: Secondary | ICD-10-CM | POA: Diagnosis not present

## 2016-11-04 DIAGNOSIS — L821 Other seborrheic keratosis: Secondary | ICD-10-CM | POA: Diagnosis not present

## 2016-11-04 DIAGNOSIS — B078 Other viral warts: Secondary | ICD-10-CM | POA: Diagnosis not present

## 2016-11-04 DIAGNOSIS — L819 Disorder of pigmentation, unspecified: Secondary | ICD-10-CM | POA: Diagnosis not present

## 2016-11-04 DIAGNOSIS — L570 Actinic keratosis: Secondary | ICD-10-CM | POA: Diagnosis not present

## 2016-11-04 DIAGNOSIS — L820 Inflamed seborrheic keratosis: Secondary | ICD-10-CM | POA: Diagnosis not present

## 2016-11-12 NOTE — Progress Notes (Signed)
[name] Date of Birth: [birth year] Medical Record #[id number]  History of Present Illness: [name] is seen for follow up CAD. Last seen 2 years ago. She has known CAD with past Taxus stent to the LAD in 2008, HTN, HLD, IDDM and GERD.In September 2014 she had an abnormal Myoview study. Cardiac cath showed nonobstructive CAD.  On follow up today she is doing well.   She denies any chest pain or SOB.  She is still sedentary. She reports symptoms of dizziness all the time - worse with standing. Also complains of persistent dry cough.   Current Outpatient Prescriptions  Medication Sig Dispense Refill  . Canagliflozin (INVOKANA) 300 MG TABS Take by mouth.    . clopidogrel (PLAVIX) 75 MG tablet take 1 tablet by mouth once daily 30 tablet 5  . estradiol (VIVELLE-DOT) 0.05 MG/24HR patch Place 1 patch (0.05 mg total) onto the skin 2 (two) times a week. 8 patch 12  . HYDROcodone-acetaminophen (NORCO/VICODIN) 5-325 MG per tablet Take 1 tablet by mouth as needed.     Marland Kitchen MAGNESIUM PO Take by mouth.    . METFORMIN HCL PO Take 500 mg by mouth 2 (two) times daily.      Marland Kitchen omeprazole (PRILOSEC) 20 MG capsule Take 20 mg by mouth daily.      [name] test strip 1 strip by Other route daily. Use 1 strip to check glucose once a day  0  . rosuvastatin (CRESTOR) 10 MG tablet Take 10 mg by mouth daily.      Marland Kitchen terconazole (TERAZOL 7) 0.4 % vaginal cream Place 1 applicator vaginally at bedtime. For 7 nights 45 g 1  . TOUJEO SOLOSTAR 300 UNIT/ML SOPN Inject 72 Units as directed daily.  0  . carvedilol (COREG) 6.25 MG tablet Take 1 tablet (6.25 mg total) by mouth 2 (two) times daily. 180 tablet 3  . losartan (COZAAR) 25 MG tablet Take 1 tablet (25 mg total) by mouth daily. 90 tablet 3   Current Facility-Administered Medications  Medication Dose Route Frequency Provider Last Rate Last Dose  . 0.9 %  sodium chloride infusion  500 mL Intravenous Continuous Irene Shipper, MD        Allergies    Allergen Reactions  . Erythromycin Other (See Comments)    Mouth ulcers    Past Medical History:  Diagnosis Date  . CAD (coronary artery disease)   . Cataract   . Chronic cough   . Diabetes mellitus   . Endometriosis   . GERD (gastroesophageal reflux disease)   . History of heart artery stent   . Hyperlipidemia   . Hypertension   . OA (osteoarthritis)   . Obesity     Past Surgical History:  Procedure Laterality Date  . ANGIOPLASTY    . APPENDECTOMY    . BREAST ENHANCEMENT SURGERY  age 56  . CARDIAC CATHETERIZATION  07/17/2006  . CARDIOVASCULAR STRESS TEST  03/31/2009   EF 72%  . CATARACT EXTRACTION  2018  . CHOLECYSTECTOMY    . heart stent    . LEFT HEART CATHETERIZATION WITH CORONARY ANGIOGRAM N/A 10/09/2012   Procedure: LEFT HEART CATHETERIZATION WITH CORONARY ANGIOGRAM;  Surgeon: Janet Nau M Martinique, MD;  Location: [facility] CATH LAB;  Service: Cardiovascular;  Laterality: N/A;  . TONSILLECTOMY    . VAGINAL HYSTERECTOMY  1973    History  Smoking Status  . Never Smoker  Smokeless Tobacco  . Never Used    History  Alcohol Use  .  0.0 oz/week    Comment: Rare    Family History  Problem Relation Age of Onset  . Alcohol abuse Mother   . Diabetes Maternal Grandmother   . Hypertension Maternal Grandmother   . Lung cancer Father     Review of Systems: The review of systems is per the HPI.  All other systems were reviewed and are negative.  Physical Exam: BP 100/60   Pulse 74   Ht 5' 5.5" (1.664 m)   Wt 201 lb (91.2 kg)   BMI 32.94 kg/m  GENERAL:  Well appearing, overweight WF in NAD HEENT:  PERRL, EOMI, sclera are clear. Oropharynx is clear. NECK:  No jugular venous distention, carotid upstroke brisk and symmetric, no bruits, no thyromegaly or adenopathy LUNGS:  Clear to auscultation bilaterally CHEST:  Unremarkable HEART:  RRR,  PMI not displaced or sustained,S1 and S2 within normal limits, no S3, no S4: no clicks, no rubs, no murmurs ABD:  Soft, nontender. BS  +, no masses or bruits. No hepatomegaly, no splenomegaly EXT:  2 + pulses throughout, no edema, no cyanosis no clubbing SKIN:  Warm and dry.  No rashes NEURO:  Alert and oriented x 3. Cranial nerves II through XII intact. PSYCH:  Cognitively intact    LABORATORY DATA:  Dated 09/13/16: cholesterol 152, triglycerides 262, HDL 41, LDL 59. A1c 7.3%.  Dated 03/23/16: normal CMET  Ecg today shows NSR rate 74, LAD. Otherwise normal. I have personally reviewed and interpreted this study.  Assessment / Plan: 1. CAD with past stenting of the LAD 2008 - Cardiac cath Sept. 2014 showed nonobstructive CAD. She is asymptomatic. Prior Myoview not reliable so will not pursue further evaluation unless symptomatic. Continue medical Rx. Encourage increase aerobic activity.  2. HTN - BP low and this is contributing to dizziness. Will reduce Coreg to 6.25 mg bid. Switch Altace to losartan 25 mg daily due to cough.  3. HLD -  Continue Crestor. LDL good at 59  4. IDDM - per primary care.   Patient is concerned about other vascular risk especially since husband had carotid surgery this year. Will arrange for screening vascular studies.   I will follow up in one year.

## 2016-11-16 ENCOUNTER — Encounter: Payer: Self-pay | Admitting: Cardiology

## 2016-11-16 ENCOUNTER — Ambulatory Visit (INDEPENDENT_AMBULATORY_CARE_PROVIDER_SITE_OTHER): Payer: Medicare HMO | Admitting: Cardiology

## 2016-11-16 VITALS — BP 100/60 | HR 74 | Ht 65.5 in | Wt 201.0 lb

## 2016-11-16 DIAGNOSIS — I2510 Atherosclerotic heart disease of native coronary artery without angina pectoris: Secondary | ICD-10-CM | POA: Diagnosis not present

## 2016-11-16 DIAGNOSIS — I10 Essential (primary) hypertension: Secondary | ICD-10-CM

## 2016-11-16 DIAGNOSIS — E782 Mixed hyperlipidemia: Secondary | ICD-10-CM

## 2016-11-16 MED ORDER — LOSARTAN POTASSIUM 25 MG PO TABS: 25.0000 mg | ORAL_TABLET | Freq: Every day | ORAL | 3 refills | Status: DC

## 2016-11-16 MED ORDER — CARVEDILOL 6.25 MG PO TABS: 6.2500 mg | ORAL_TABLET | Freq: Two times a day (BID) | ORAL | 3 refills | Status: DC

## 2016-11-16 NOTE — Patient Instructions (Signed)
Stop taking ramipril  Start losartan 25 mg daily  Reduce Coreg to 6.25 mg twice a day  We will arrange vascular imaging  I will see you in one year.

## 2016-12-05 ENCOUNTER — Ambulatory Visit (HOSPITAL_COMMUNITY)
Admission: RE | Admit: 2016-12-05 | Discharge: 2016-12-05 | Disposition: A | Discharge status: Home / Self Care | Payer: Medicare HMO | Source: Ambulatory Visit | Attending: Cardiology | Admitting: Cardiology

## 2016-12-05 DIAGNOSIS — I2510 Atherosclerotic heart disease of native coronary artery without angina pectoris: Secondary | ICD-10-CM

## 2016-12-05 DIAGNOSIS — E782 Mixed hyperlipidemia: Secondary | ICD-10-CM

## 2016-12-05 DIAGNOSIS — I10 Essential (primary) hypertension: Secondary | ICD-10-CM

## 2016-12-21 DIAGNOSIS — M79641 Pain in right hand: Secondary | ICD-10-CM | POA: Diagnosis not present

## 2016-12-21 DIAGNOSIS — R5382 Chronic fatigue, unspecified: Secondary | ICD-10-CM | POA: Diagnosis not present

## 2016-12-21 DIAGNOSIS — E669 Obesity, unspecified: Secondary | ICD-10-CM | POA: Diagnosis not present

## 2016-12-21 DIAGNOSIS — M2550 Pain in unspecified joint: Secondary | ICD-10-CM | POA: Diagnosis not present

## 2016-12-21 DIAGNOSIS — M79642 Pain in left hand: Secondary | ICD-10-CM | POA: Diagnosis not present

## 2016-12-21 DIAGNOSIS — Z6832 Body mass index (BMI) 32.0-32.9, adult: Secondary | ICD-10-CM | POA: Diagnosis not present

## 2016-12-21 DIAGNOSIS — M1990 Unspecified osteoarthritis, unspecified site: Secondary | ICD-10-CM | POA: Diagnosis not present

## 2017-03-20 DIAGNOSIS — E1151 Type 2 diabetes mellitus with diabetic peripheral angiopathy without gangrene: Secondary | ICD-10-CM | POA: Diagnosis not present

## 2017-03-20 DIAGNOSIS — E7800 Pure hypercholesterolemia, unspecified: Secondary | ICD-10-CM | POA: Diagnosis not present

## 2017-03-20 DIAGNOSIS — Z6833 Body mass index (BMI) 33.0-33.9, adult: Secondary | ICD-10-CM | POA: Diagnosis not present

## 2017-03-20 DIAGNOSIS — D692 Other nonthrombocytopenic purpura: Secondary | ICD-10-CM | POA: Diagnosis not present

## 2017-03-20 DIAGNOSIS — F325 Major depressive disorder, single episode, in full remission: Secondary | ICD-10-CM | POA: Diagnosis not present

## 2017-03-20 DIAGNOSIS — M47892 Other spondylosis, cervical region: Secondary | ICD-10-CM | POA: Diagnosis not present

## 2017-03-20 DIAGNOSIS — I10 Essential (primary) hypertension: Secondary | ICD-10-CM | POA: Diagnosis not present

## 2017-03-20 DIAGNOSIS — I2510 Atherosclerotic heart disease of native coronary artery without angina pectoris: Secondary | ICD-10-CM | POA: Diagnosis not present

## 2017-03-20 DIAGNOSIS — I119 Hypertensive heart disease without heart failure: Secondary | ICD-10-CM | POA: Diagnosis not present

## 2017-03-20 DIAGNOSIS — E668 Other obesity: Secondary | ICD-10-CM | POA: Diagnosis not present

## 2017-03-20 DIAGNOSIS — M1380 Other specified arthritis, unspecified site: Secondary | ICD-10-CM | POA: Diagnosis not present

## 2017-03-20 DIAGNOSIS — Z794 Long term (current) use of insulin: Secondary | ICD-10-CM | POA: Diagnosis not present

## 2017-04-04 ENCOUNTER — Telehealth: Payer: Self-pay | Admitting: *Deleted

## 2017-04-04 NOTE — Telephone Encounter (Signed)
Prior authorization done online via [employer] for estradiol 0.05mg  patch approved twice weekly from 04/03/17-01/23/18

## 2017-06-20 DIAGNOSIS — E669 Obesity, unspecified: Secondary | ICD-10-CM | POA: Diagnosis not present

## 2017-06-20 DIAGNOSIS — M2550 Pain in unspecified joint: Secondary | ICD-10-CM | POA: Diagnosis not present

## 2017-06-20 DIAGNOSIS — R5382 Chronic fatigue, unspecified: Secondary | ICD-10-CM | POA: Diagnosis not present

## 2017-06-20 DIAGNOSIS — Z6833 Body mass index (BMI) 33.0-33.9, adult: Secondary | ICD-10-CM | POA: Diagnosis not present

## 2017-06-20 DIAGNOSIS — M79641 Pain in right hand: Secondary | ICD-10-CM | POA: Diagnosis not present

## 2017-06-20 DIAGNOSIS — M1990 Unspecified osteoarthritis, unspecified site: Secondary | ICD-10-CM | POA: Diagnosis not present

## 2017-06-20 DIAGNOSIS — M79642 Pain in left hand: Secondary | ICD-10-CM | POA: Diagnosis not present

## 2017-09-26 DIAGNOSIS — E1151 Type 2 diabetes mellitus with diabetic peripheral angiopathy without gangrene: Secondary | ICD-10-CM | POA: Diagnosis not present

## 2017-09-26 DIAGNOSIS — I10 Essential (primary) hypertension: Secondary | ICD-10-CM | POA: Diagnosis not present

## 2017-09-26 DIAGNOSIS — R82998 Other abnormal findings in urine: Secondary | ICD-10-CM | POA: Diagnosis not present

## 2017-09-26 DIAGNOSIS — E7800 Pure hypercholesterolemia, unspecified: Secondary | ICD-10-CM | POA: Diagnosis not present

## 2017-09-28 ENCOUNTER — Encounter: Payer: Self-pay | Admitting: Gynecology

## 2017-09-28 DIAGNOSIS — Z1231 Encounter for screening mammogram for malignant neoplasm of breast: Secondary | ICD-10-CM | POA: Diagnosis not present

## 2017-10-03 DIAGNOSIS — Z23 Encounter for immunization: Secondary | ICD-10-CM | POA: Diagnosis not present

## 2017-10-03 DIAGNOSIS — I2510 Atherosclerotic heart disease of native coronary artery without angina pectoris: Secondary | ICD-10-CM | POA: Diagnosis not present

## 2017-10-03 DIAGNOSIS — E1151 Type 2 diabetes mellitus with diabetic peripheral angiopathy without gangrene: Secondary | ICD-10-CM | POA: Diagnosis not present

## 2017-10-03 DIAGNOSIS — Z6833 Body mass index (BMI) 33.0-33.9, adult: Secondary | ICD-10-CM | POA: Diagnosis not present

## 2017-10-03 DIAGNOSIS — Z1389 Encounter for screening for other disorder: Secondary | ICD-10-CM | POA: Diagnosis not present

## 2017-10-03 DIAGNOSIS — M1380 Other specified arthritis, unspecified site: Secondary | ICD-10-CM | POA: Diagnosis not present

## 2017-10-03 DIAGNOSIS — I119 Hypertensive heart disease without heart failure: Secondary | ICD-10-CM | POA: Diagnosis not present

## 2017-10-03 DIAGNOSIS — E1165 Type 2 diabetes mellitus with hyperglycemia: Secondary | ICD-10-CM | POA: Diagnosis not present

## 2017-10-03 DIAGNOSIS — Z0000 Encounter for general adult medical examination without abnormal findings: Secondary | ICD-10-CM | POA: Diagnosis not present

## 2017-10-03 DIAGNOSIS — Z794 Long term (current) use of insulin: Secondary | ICD-10-CM | POA: Diagnosis not present

## 2017-10-03 DIAGNOSIS — E7800 Pure hypercholesterolemia, unspecified: Secondary | ICD-10-CM | POA: Diagnosis not present

## 2017-10-03 DIAGNOSIS — I10 Essential (primary) hypertension: Secondary | ICD-10-CM | POA: Diagnosis not present

## 2017-10-03 DIAGNOSIS — M47892 Other spondylosis, cervical region: Secondary | ICD-10-CM | POA: Diagnosis not present

## 2017-11-06 ENCOUNTER — Other Ambulatory Visit: Payer: Self-pay

## 2017-11-06 MED ORDER — LOSARTAN POTASSIUM 25 MG PO TABS: 25.0000 mg | ORAL_TABLET | Freq: Every day | ORAL | 3 refills | Status: DC

## 2017-11-06 MED ORDER — CARVEDILOL 6.25 MG PO TABS: 6.2500 mg | ORAL_TABLET | Freq: Two times a day (BID) | ORAL | 3 refills | Status: DC

## 2017-11-21 ENCOUNTER — Encounter: Payer: Self-pay | Admitting: Gynecology

## 2017-11-21 ENCOUNTER — Ambulatory Visit (INDEPENDENT_AMBULATORY_CARE_PROVIDER_SITE_OTHER): Payer: PPO | Admitting: Gynecology

## 2017-11-21 VITALS — BP 122/76 | Ht 65.5 in | Wt 205.0 lb

## 2017-11-21 DIAGNOSIS — N816 Rectocele: Secondary | ICD-10-CM | POA: Diagnosis not present

## 2017-11-21 DIAGNOSIS — Z79890 Hormone replacement therapy (postmenopausal): Secondary | ICD-10-CM | POA: Diagnosis not present

## 2017-11-21 DIAGNOSIS — Z01419 Encounter for gynecological examination (general) (routine) without abnormal findings: Secondary | ICD-10-CM

## 2017-11-21 DIAGNOSIS — N952 Postmenopausal atrophic vaginitis: Secondary | ICD-10-CM | POA: Diagnosis not present

## 2017-11-21 DIAGNOSIS — D171 Benign lipomatous neoplasm of skin and subcutaneous tissue of trunk: Secondary | ICD-10-CM

## 2017-11-21 DIAGNOSIS — N7011 Chronic salpingitis: Secondary | ICD-10-CM | POA: Diagnosis not present

## 2017-11-21 MED ORDER — ESTRADIOL 0.05 MG/24HR TD PTTW: 1.0000 | MEDICATED_PATCH | TRANSDERMAL | 12 refills | Status: DC

## 2017-11-21 NOTE — Patient Instructions (Signed)
Follow-up for the bone density as scheduled. 

## 2017-11-21 NOTE — Progress Notes (Signed)
    [name] 1940-06-08 [id number]        77 y.o.  [id number] for breast and pelvic exam  Past medical history,surgical history, problem list, medications, allergies, family history and social history were all reviewed and documented as reviewed in the EPIC chart.  ROS:  Performed with pertinent positives and negatives included in the history, assessment and plan.   Additional significant findings : None   Exam: [name] assistant Vitals:   11/21/17 1120  BP: 122/76  Weight: 205 lb (93 kg)  Height: 5' 5.5" (1.664 m)   Body mass index is 33.59 kg/m.  General appearance:  Normal affect, orientation and appearance. Skin: Grossly normal excepting numerous seborrheic keratoses.  Lipomatous feeling 2 to 3 cm mass left mid shoulder. HEENT: Without gross lesions.  No cervical or supraclavicular adenopathy. Thyroid normal.  Lungs:  Clear without wheezing, rales or rhonchi Cardiac: RR, without RMG Abdominal:  Soft, nontender, without masses, guarding, rebound, organomegaly or hernia Breasts:  Examined lying and sitting without masses, retractions, discharge or axillary adenopathy.  Bilateral implants noted Pelvic:  Ext, BUS, Vagina: With atrophic changes.  First to second-degree rectocele.  Cuff well supported  Adnexa: Without masses or tenderness    Anus and perineum: Normal   Rectovaginal: Normal sphincter tone without palpated masses or tenderness.    Assessment/Plan:  77 y.o. [name] female for breast and pelvic exam.   1. Postmenopausal/ERT.  Continues on Vivelle 0.05 mg patches.  Tries to space out but has unacceptable symptoms.  We discussed the risks to include a realistic discussion of stroke heart attack DVT.  Benefits to include symptom relief as well as bone health and possible cardiovascular also discussed.  At this point the patient wants to continue clearly understanding the risks.  Refill x1 year provided. 2. Rectocele.  Stable on serial exams.  Asymptomatic to  the patient. 3. Lipoma left shoulder.  Classic exam.  Options for general surgical referral now or monitoring reviewed.  Patient prefers to monitor and as long as remains stable will follow.  If it enlarges she knows to call and we will refer. 4. History of hydrosalpinx on the left.  Followed with ultrasounds remaining stable in the past.  We had decided to stop screening with ultrasound given the stability.  She is asymptomatic without discomfort.  Bimanual exam although limited by abdominal girth is negative.  She is comfortable with ongoing expectant management. 5. Pap smear 2012.  No Pap smear done today.  No history of significant abnormal Pap smears.  We both agree to stop screening based on age and hysterectomy history. 6. DEXA 2014 normal.  Recommend DEXA now at 5-year interval when she agrees to schedule in follow-up for this. 7. Colonoscopy 2012.  Repeat at their recommended interval. 8. Mammography 09/2017.  Continue with annual mammography when due.  Breast exam normal today. 9. Health maintenance.  No routine lab work done as patient does this elsewhere.  Follow-up 1 year, sooner as needed.   Janet Auerbach MD, 11:51 AM 11/21/2017

## 2017-12-12 ENCOUNTER — Other Ambulatory Visit: Payer: Self-pay | Admitting: Gynecology

## 2017-12-12 ENCOUNTER — Ambulatory Visit (INDEPENDENT_AMBULATORY_CARE_PROVIDER_SITE_OTHER): Payer: PPO

## 2017-12-12 DIAGNOSIS — M8589 Other specified disorders of bone density and structure, multiple sites: Secondary | ICD-10-CM

## 2017-12-12 DIAGNOSIS — Z780 Asymptomatic menopausal state: Secondary | ICD-10-CM

## 2017-12-12 DIAGNOSIS — Z01419 Encounter for gynecological examination (general) (routine) without abnormal findings: Secondary | ICD-10-CM

## 2017-12-14 ENCOUNTER — Encounter: Payer: Self-pay | Admitting: Gynecology

## 2017-12-18 ENCOUNTER — Other Ambulatory Visit: Payer: Self-pay | Admitting: Gynecology

## 2017-12-18 DIAGNOSIS — Z780 Asymptomatic menopausal state: Secondary | ICD-10-CM

## 2017-12-22 NOTE — Progress Notes (Signed)
[name] Date of Birth: 1940-07-07 Medical Record #[id number]  History of Present Illness: [name] is seen for follow up CAD.  She has known CAD with past Taxus stent to the LAD in 2008, HTN, HLD, IDDM and GERD. In September 2014 she had an abnormal Myoview study. Cardiac cath showed nonobstructive CAD.  In October 2018 she had vascular screening showing mild nonobstructive carotid plaque. Abdominal US showed no aneurysm. LE arterial dopplers were normal.   On follow up today she is doing well.   She denies any chest pain or SOB.  She notes some redness on her left ankle but no swelling or tenderness. She does not exercise much.   Current Outpatient Medications  Medication Sig Dispense Refill  . Canagliflozin (INVOKANA) 300 MG TABS Take by mouth.    . carvedilol (COREG) 6.25 MG tablet Take 1 tablet (6.25 mg total) by mouth 2 (two) times daily. 180 tablet 3  . clopidogrel (PLAVIX) 75 MG tablet take 1 tablet by mouth once daily 30 tablet 5  . estradiol (VIVELLE-DOT) 0.05 MG/24HR patch Place 1 patch (0.05 mg total) onto the skin 2 (two) times a week. 8 patch 12  . HYDROcodone-acetaminophen (NORCO/VICODIN) 5-325 MG per tablet Take 1 tablet by mouth as needed.     Marland Kitchen losartan (COZAAR) 25 MG tablet Take 1 tablet (25 mg total) by mouth daily. 90 tablet 3  . MAGNESIUM PO Take by mouth.    . METFORMIN HCL PO Take 500 mg by mouth 2 (two) times daily.      Marland Kitchen omeprazole (PRILOSEC) 20 MG capsule Take 20 mg by mouth daily.      [name] test strip 1 strip by Other route daily. Use 1 strip to check glucose once a day  0  . rosuvastatin (CRESTOR) 10 MG tablet Take 10 mg by mouth daily.      [name] 300 UNIT/ML SOPN Inject 72 Units as directed daily.  0   Current Facility-Administered Medications  Medication Dose Route Frequency Provider Last Rate Last Dose  . 0.9 %  sodium chloride infusion  500 mL Intravenous Continuous Irene Shipper, MD        Allergies  Allergen  Reactions  . Erythromycin Other (See Comments)    Mouth ulcers    Past Medical History:  Diagnosis Date  . CAD (coronary artery disease)   . Cataract   . Chronic cough   . Diabetes mellitus   . Endometriosis   . GERD (gastroesophageal reflux disease)   . History of heart artery stent   . Hyperlipidemia   . Hypertension   . OA (osteoarthritis)   . Obesity     Past Surgical History:  Procedure Laterality Date  . ANGIOPLASTY    . APPENDECTOMY    . BREAST ENHANCEMENT SURGERY  age 86  . CARDIAC CATHETERIZATION  07/17/2006  . CARDIOVASCULAR STRESS TEST  03/31/2009   EF 72%  . CATARACT EXTRACTION  2018  . CHOLECYSTECTOMY    . heart stent    . LEFT HEART CATHETERIZATION WITH CORONARY ANGIOGRAM N/A 10/09/2012   Procedure: LEFT HEART CATHETERIZATION WITH CORONARY ANGIOGRAM;  Surgeon: Padraic Marinos M [place], MD;  Location: [facility] C[facility] LAB;  Service: Cardiovascular;  Laterality: N/A;  . TONSILLECTOMY    . VAGINAL HYSTERECTOMY  1973    Social History   Tobacco Use  Smoking Status Never Smoker  Smokeless Tobacco Never Used    Social History   Substance and Sexual Activity  Alcohol Use Yes  . Alcohol/week: 0.0 standard drinks   Comment: Rare    Family History  Problem Relation Age of Onset  . Alcohol abuse Mother   . Diabetes Maternal Grandmother   . Hypertension Maternal Grandmother   . Lung cancer Father     Review of Systems: The review of systems is per the HPI.  All other systems were reviewed and are negative.  Physical Exam: There were no vitals taken for this visit. GENERAL:  Well appearing obese WF in NAD HEENT:  PERRL, EOMI, sclera are clear. Oropharynx is clear. NECK:  No jugular venous distention, carotid upstroke brisk and symmetric, no bruits, no thyromegaly or adenopathy LUNGS:  Clear to auscultation bilaterally CHEST:  Unremarkable HEART:  RRR,  PMI not displaced or sustained,S1 and S2 within normal limits, no S3, no S4: no clicks, no rubs, no murmurs ABD:   Soft, nontender. BS +, no masses or bruits. No hepatomegaly, no splenomegaly EXT:  2 + pulses throughout, no edema, no cyanosis no clubbing SKIN:  Warm and dry.  No rashes. Scaly plaques on LE.  NEURO:  Alert and oriented x 3. Cranial nerves II through XII intact. PSYCH:  Cognitively intact      LABORATORY DATA:  Dated 09/13/16: cholesterol 152, triglycerides 262, HDL 41, LDL 59. A1c 7.3%.  Dated 03/23/16: normal CMET Dated 10/03/17: glucose 200. Otherwise normal CMET and CBC. Cholesterol 170, triglycerides 247, HDL 49, LDL 72, Apo B 101. A1c 7.4%.   Ecg today shows NSR rate 71, Normal Ecg.  I have personally reviewed and interpreted this study.  Assessment / Plan: 1. CAD with past stenting of the LAD 2008 - Cardiac cath Sept. 2014 showed nonobstructive CAD. She is asymptomatic. Prior Myoview not reliable so will not pursue further evaluation unless symptomatic. Continue medical Rx. Encourage increase aerobic activity.  2. HTN - BP is well controlled. We previously switched Altace to losartan for cough but she states cough really hasn't changed. Will continue current therapy.  3. HLD -  Continue Crestor. LDL well controlled.  4. IDDM - per primary care.   I will follow up in one year.

## 2017-12-27 ENCOUNTER — Encounter: Payer: Self-pay | Admitting: Cardiology

## 2017-12-27 ENCOUNTER — Ambulatory Visit: Payer: PPO | Admitting: Cardiology

## 2017-12-27 VITALS — BP 125/66 | HR 71 | Ht 65.5 in | Wt 201.0 lb

## 2017-12-27 DIAGNOSIS — I2510 Atherosclerotic heart disease of native coronary artery without angina pectoris: Secondary | ICD-10-CM | POA: Diagnosis not present

## 2017-12-27 DIAGNOSIS — I10 Essential (primary) hypertension: Secondary | ICD-10-CM

## 2017-12-27 DIAGNOSIS — E782 Mixed hyperlipidemia: Secondary | ICD-10-CM

## 2018-01-10 DIAGNOSIS — D2261 Melanocytic nevi of right upper limb, including shoulder: Secondary | ICD-10-CM | POA: Diagnosis not present

## 2018-01-10 DIAGNOSIS — L821 Other seborrheic keratosis: Secondary | ICD-10-CM | POA: Diagnosis not present

## 2018-01-10 DIAGNOSIS — L918 Other hypertrophic disorders of the skin: Secondary | ICD-10-CM | POA: Diagnosis not present

## 2018-01-10 DIAGNOSIS — D485 Neoplasm of uncertain behavior of skin: Secondary | ICD-10-CM | POA: Diagnosis not present

## 2018-01-10 DIAGNOSIS — L820 Inflamed seborrheic keratosis: Secondary | ICD-10-CM | POA: Diagnosis not present

## 2018-01-10 DIAGNOSIS — L739 Follicular disorder, unspecified: Secondary | ICD-10-CM | POA: Diagnosis not present

## 2018-01-10 DIAGNOSIS — D1801 Hemangioma of skin and subcutaneous tissue: Secondary | ICD-10-CM | POA: Diagnosis not present

## 2018-04-20 DIAGNOSIS — I119 Hypertensive heart disease without heart failure: Secondary | ICD-10-CM | POA: Diagnosis not present

## 2018-04-20 DIAGNOSIS — E1165 Type 2 diabetes mellitus with hyperglycemia: Secondary | ICD-10-CM | POA: Diagnosis not present

## 2018-04-20 DIAGNOSIS — E1151 Type 2 diabetes mellitus with diabetic peripheral angiopathy without gangrene: Secondary | ICD-10-CM | POA: Diagnosis not present

## 2018-04-20 DIAGNOSIS — E668 Other obesity: Secondary | ICD-10-CM | POA: Diagnosis not present

## 2018-04-20 DIAGNOSIS — D692 Other nonthrombocytopenic purpura: Secondary | ICD-10-CM | POA: Diagnosis not present

## 2018-04-20 DIAGNOSIS — Z6832 Body mass index (BMI) 32.0-32.9, adult: Secondary | ICD-10-CM | POA: Diagnosis not present

## 2018-04-20 DIAGNOSIS — E7800 Pure hypercholesterolemia, unspecified: Secondary | ICD-10-CM | POA: Diagnosis not present

## 2018-04-20 DIAGNOSIS — Z794 Long term (current) use of insulin: Secondary | ICD-10-CM | POA: Diagnosis not present

## 2018-04-20 DIAGNOSIS — I2510 Atherosclerotic heart disease of native coronary artery without angina pectoris: Secondary | ICD-10-CM | POA: Diagnosis not present

## 2018-04-25 DIAGNOSIS — L821 Other seborrheic keratosis: Secondary | ICD-10-CM | POA: Diagnosis not present

## 2018-04-25 DIAGNOSIS — M9901 Segmental and somatic dysfunction of cervical region: Secondary | ICD-10-CM | POA: Diagnosis not present

## 2018-04-25 DIAGNOSIS — M542 Cervicalgia: Secondary | ICD-10-CM | POA: Diagnosis not present

## 2018-04-25 DIAGNOSIS — L820 Inflamed seborrheic keratosis: Secondary | ICD-10-CM | POA: Diagnosis not present

## 2018-04-25 DIAGNOSIS — M47812 Spondylosis without myelopathy or radiculopathy, cervical region: Secondary | ICD-10-CM | POA: Diagnosis not present

## 2018-04-25 DIAGNOSIS — H2511 Age-related nuclear cataract, right eye: Secondary | ICD-10-CM | POA: Diagnosis not present

## 2018-04-25 DIAGNOSIS — M9902 Segmental and somatic dysfunction of thoracic region: Secondary | ICD-10-CM | POA: Diagnosis not present

## 2018-06-21 DIAGNOSIS — R5382 Chronic fatigue, unspecified: Secondary | ICD-10-CM | POA: Diagnosis not present

## 2018-06-21 DIAGNOSIS — M1990 Unspecified osteoarthritis, unspecified site: Secondary | ICD-10-CM | POA: Diagnosis not present

## 2018-06-21 DIAGNOSIS — M79642 Pain in left hand: Secondary | ICD-10-CM | POA: Diagnosis not present

## 2018-06-21 DIAGNOSIS — M79641 Pain in right hand: Secondary | ICD-10-CM | POA: Diagnosis not present

## 2018-06-21 DIAGNOSIS — M150 Primary generalized (osteo)arthritis: Secondary | ICD-10-CM | POA: Diagnosis not present

## 2018-06-21 DIAGNOSIS — M2550 Pain in unspecified joint: Secondary | ICD-10-CM | POA: Diagnosis not present

## 2018-10-02 ENCOUNTER — Encounter: Payer: Self-pay | Admitting: Cardiology

## 2018-10-02 DIAGNOSIS — I10 Essential (primary) hypertension: Secondary | ICD-10-CM | POA: Diagnosis not present

## 2018-10-02 DIAGNOSIS — E1151 Type 2 diabetes mellitus with diabetic peripheral angiopathy without gangrene: Secondary | ICD-10-CM | POA: Diagnosis not present

## 2018-10-05 ENCOUNTER — Encounter: Payer: Self-pay | Admitting: Cardiology

## 2018-10-05 DIAGNOSIS — R82998 Other abnormal findings in urine: Secondary | ICD-10-CM | POA: Diagnosis not present

## 2018-10-05 DIAGNOSIS — E1151 Type 2 diabetes mellitus with diabetic peripheral angiopathy without gangrene: Secondary | ICD-10-CM | POA: Diagnosis not present

## 2018-10-08 DIAGNOSIS — Z0000 Encounter for general adult medical examination without abnormal findings: Secondary | ICD-10-CM | POA: Diagnosis not present

## 2018-10-08 DIAGNOSIS — M47892 Other spondylosis, cervical region: Secondary | ICD-10-CM | POA: Diagnosis not present

## 2018-10-08 DIAGNOSIS — Z79890 Hormone replacement therapy (postmenopausal): Secondary | ICD-10-CM | POA: Diagnosis not present

## 2018-10-08 DIAGNOSIS — M1380 Other specified arthritis, unspecified site: Secondary | ICD-10-CM | POA: Diagnosis not present

## 2018-10-08 DIAGNOSIS — D692 Other nonthrombocytopenic purpura: Secondary | ICD-10-CM | POA: Diagnosis not present

## 2018-10-08 DIAGNOSIS — E1151 Type 2 diabetes mellitus with diabetic peripheral angiopathy without gangrene: Secondary | ICD-10-CM | POA: Diagnosis not present

## 2018-10-08 DIAGNOSIS — Z794 Long term (current) use of insulin: Secondary | ICD-10-CM | POA: Diagnosis not present

## 2018-10-08 DIAGNOSIS — E1165 Type 2 diabetes mellitus with hyperglycemia: Secondary | ICD-10-CM | POA: Diagnosis not present

## 2018-10-08 DIAGNOSIS — I119 Hypertensive heart disease without heart failure: Secondary | ICD-10-CM | POA: Diagnosis not present

## 2018-10-08 DIAGNOSIS — E7800 Pure hypercholesterolemia, unspecified: Secondary | ICD-10-CM | POA: Diagnosis not present

## 2018-10-08 DIAGNOSIS — Z1339 Encounter for screening examination for other mental health and behavioral disorders: Secondary | ICD-10-CM | POA: Diagnosis not present

## 2018-10-08 DIAGNOSIS — Z1331 Encounter for screening for depression: Secondary | ICD-10-CM | POA: Diagnosis not present

## 2018-10-08 DIAGNOSIS — I2510 Atherosclerotic heart disease of native coronary artery without angina pectoris: Secondary | ICD-10-CM | POA: Diagnosis not present

## 2018-10-24 DIAGNOSIS — H35372 Puckering of macula, left eye: Secondary | ICD-10-CM | POA: Diagnosis not present

## 2018-11-01 ENCOUNTER — Encounter: Payer: Self-pay | Admitting: Gynecology

## 2018-11-01 DIAGNOSIS — Z23 Encounter for immunization: Secondary | ICD-10-CM | POA: Diagnosis not present

## 2018-11-12 ENCOUNTER — Other Ambulatory Visit: Payer: Self-pay

## 2018-11-12 MED ORDER — LOSARTAN POTASSIUM 25 MG PO TABS: 25.0000 mg | ORAL_TABLET | Freq: Every day | ORAL | 3 refills | Status: DC

## 2018-11-22 ENCOUNTER — Other Ambulatory Visit: Payer: Self-pay

## 2018-11-23 ENCOUNTER — Ambulatory Visit (INDEPENDENT_AMBULATORY_CARE_PROVIDER_SITE_OTHER): Payer: PPO | Admitting: Gynecology

## 2018-11-23 ENCOUNTER — Encounter: Payer: Self-pay | Admitting: Gynecology

## 2018-11-23 VITALS — BP 122/80 | Ht 65.5 in | Wt 199.0 lb

## 2018-11-23 DIAGNOSIS — D171 Benign lipomatous neoplasm of skin and subcutaneous tissue of trunk: Secondary | ICD-10-CM

## 2018-11-23 DIAGNOSIS — Z01419 Encounter for gynecological examination (general) (routine) without abnormal findings: Secondary | ICD-10-CM

## 2018-11-23 DIAGNOSIS — N816 Rectocele: Secondary | ICD-10-CM | POA: Diagnosis not present

## 2018-11-23 DIAGNOSIS — N952 Postmenopausal atrophic vaginitis: Secondary | ICD-10-CM | POA: Diagnosis not present

## 2018-11-23 DIAGNOSIS — Z79890 Hormone replacement therapy (postmenopausal): Secondary | ICD-10-CM

## 2018-11-23 MED ORDER — ESTRADIOL 0.05 MG/24HR TD PTTW: 1.0000 | MEDICATED_PATCH | TRANSDERMAL | 4 refills | Status: AC

## 2018-11-23 NOTE — Progress Notes (Signed)
    [name] 1940/05/09 [id number]        78 y.o.  [id number] for breast and pelvic exam.  Without gynecologic complaints  Past medical history,surgical history, problem list, medications, allergies, family history and social history were all reviewed and documented as reviewed in the EPIC chart.  ROS:  Performed with pertinent positives and negatives included in the history, assessment and plan.   Additional significant findings : None   Exam: [name] assistant Vitals:   11/23/18 0921  BP: 122/80  Weight: 199 lb (90.3 kg)  Height: 5' 5.5" (1.664 m)   Body mass index is 32.61 kg/m.  General appearance:  Normal affect, orientation and appearance. Skin: Grossly normal excepting numerous appropriate keratoses covering her body.  Small lipomatous area mid left shoulder HEENT: Without gross lesions.  No cervical or supraclavicular adenopathy. Thyroid normal.  Lungs:  Clear without wheezing, rales or rhonchi Cardiac: RR, without RMG Abdominal:  Soft, nontender, without masses, guarding, rebound, organomegaly or hernia Breasts:  Examined lying and sitting without masses, retractions, discharge or axillary adenopathy. Pelvic:  Ext, BUS, Vagina: With atrophic changes.  Second-degree rectocele.  Mild cystocele.  Cuff well supported  Adnexa: Without masses or tenderness    Anus and perineum: Normal   Rectovaginal: Normal sphincter tone without palpated masses or tenderness.    Assessment/Plan:  78 y.o. [id number] female for breast and pelvic exam.  Status post hysterectomy in the past  1. Postmenopausal/HRT.  Continues on Vivelle 0.05 mg patch.  When she misses changing she has unacceptable hot flushes.  We again discussed the risks particularly at age 56.  Risk of thrombosis such as stroke heart attack DVT in the breast cancer issue reviewed.  Benefits of transdermal absorption also discussed.  At this point the patient feels it is a quality of life issue and wants to continue.   Refill x1 year provided. 2. Rectocele and mild cystocele.  Asymptomatic to the patient.  Continue with expectant management and report any symptoms. 3. Mammography due now and patient will schedule.  Breast exam normal today. 4. Small lipoma left shoulder stable on exam.  Asymptomatic to the patient.  Continue to follow with self exams.  Report any changes. 5. History of small hydrosalpinx on the left followed with serial ultrasounds remaining stable.  We both agreed to stop following.  She remains asymptomatic.  Exam is normal. 6. Pap smear 2012.  No Pap smear done today.  No history of abnormal Pap smears.  We both agree to stop screening per current screening guidelines. 7. Colonoscopy 2012.  Colon screening per primary provider's recommendations. 8. DEXA 2019 normal.  Repeat at 5-year interval. 9. Health maintenance.  No routine lab work done as patient does this elsewhere.  Follow-up 1 year, sooner as needed.   [name] MD, 9:38 AM 11/23/2018

## 2018-11-23 NOTE — Patient Instructions (Signed)
Follow-up in 1 year for annual exam, sooner as needed. 

## 2018-11-26 ENCOUNTER — Other Ambulatory Visit: Payer: Self-pay

## 2018-11-26 MED ORDER — CARVEDILOL 6.25 MG PO TABS: 6.2500 mg | ORAL_TABLET | Freq: Two times a day (BID) | ORAL | 1 refills | Status: DC

## 2019-01-24 NOTE — Progress Notes (Signed)
Virtual Visit via Video Note   This visit type was conducted due to national recommendations for restrictions regarding the COVID-19 Pandemic (e.g. social distancing) in an effort to limit this patient's exposure and mitigate transmission in our community.  Due to her co-morbid illnesses, this patient is at least at moderate risk for complications without adequate follow up.  This format is felt to be most appropriate for this patient at this time.  All issues noted in this document were discussed and addressed.  A limited physical exam was performed with this format.  Please refer to the patient's chart for her consent to telehealth for [facility].   Date:  01/28/2019   ID:  [name], DOB 04/07/1940, MRN [id number]  Patient Location: Home Provider Location: Home  PCP:  Tisovec, Fransico Him, MD  Cardiologist:  Natan Hartog Martin[place] MD Electrophysiologist:  None   Evaluation Performed:  Follow-Up Visit  Chief Complaint:  Follow up CAD  History of Present Illness:    [name] is a 78 y.o. female with  known CAD with past Taxus stent to the LAD in 2008, HTN, HLD, IDDM and GERD. In September 2014 she had an abnormal Myoview study. Cardiac cath showed nonobstructive CAD.  In October 2018 she had vascular screening showing mild nonobstructive carotid plaque. Abdominal US showed no aneurysm. LE arterial dopplers were normal.   On follow up today she is doing well. Not traveling much due to the pandemic. Denies any chest pain or dyspnea. States she is not able to exercise much due to the pandemic.   The patient does not have symptoms concerning for COVID-19 infection (fever, chills, cough, or new shortness of breath).    Past Medical History:  Diagnosis Date  . CAD (coronary artery disease)   . Cataract   . Chronic cough   . Diabetes mellitus   . Endometriosis   . GERD (gastroesophageal reflux disease)   . History of heart artery stent   . Hyperlipidemia   .  Hypertension   . OA (osteoarthritis)   . Obesity    Past Surgical History:  Procedure Laterality Date  . ANGIOPLASTY    . APPENDECTOMY    . BREAST ENHANCEMENT SURGERY  age 42  . CARDIAC CATHETERIZATION  07/17/2006  . CARDIOVASCULAR STRESS TEST  03/31/2009   EF 72%  . CATARACT EXTRACTION  2018  . CHOLECYSTECTOMY    . heart stent    . LEFT HEART CATHETERIZATION WITH CORONARY ANGIOGRAM N/A 10/09/2012   Procedure: LEFT HEART CATHETERIZATION WITH CORONARY ANGIOGRAM;  Surgeon: Naitik Hermann M Martinique, MD;  Location: [facility] CATH LAB;  Service: Cardiovascular;  Laterality: N/A;  . TONSILLECTOMY    . VAGINAL HYSTERECTOMY  1973     Current Meds  Medication Sig  . aspirin EC 81 MG tablet Take 81 mg by mouth daily.  . Canagliflozin (INVOKANA) 300 MG TABS Take by mouth.  . carvedilol (COREG) 6.25 MG tablet Take 1 tablet (6.25 mg total) by mouth 2 (two) times daily. PLEASE SCHEDULE AN APPT FOR FUTURE REFILLS  . clopidogrel (PLAVIX) 75 MG tablet take 1 tablet by mouth once daily  . estradiol (VIVELLE-DOT) 0.05 MG/24HR patch Place 1 patch (0.05 mg total) onto the skin 2 (two) times a week.  . Ibuprofen-diphenhydrAMINE Cit (ADVIL PM PO) Take by mouth at bedtime.  Marland Kitchen losartan (COZAAR) 25 MG tablet Take 1 tablet (25 mg total) by mouth daily.  Marland Kitchen MAGNESIUM PO Take by mouth.  . METFORMIN HCL PO Take 500  mg by mouth 2 (two) times daily.    Marland Kitchen omeprazole (PRILOSEC) 20 MG capsule Take 20 mg by mouth daily.    [name] test strip 1 strip by Other route daily. Use 1 strip to check glucose once a day  . rosuvastatin (CRESTOR) 10 MG tablet Take 10 mg by mouth daily.    [name] 300 UNIT/ML SOPN Inject 72 Units as directed daily.   Current Facility-Administered Medications for the 01/28/19 encounter (Telemedicine) with Martin[place], Melvine Julin M, MD  Medication  . 0.9 %  sodium chloride infusion     Allergies:   Erythromycin   Social History   Tobacco Use  . Smoking status: Never Smoker  . Smokeless tobacco:  Never Used  Substance Use Topics  . Alcohol use: Yes    Alcohol/week: 0.0 standard drinks    Comment: Rare  . Drug use: No     Family Hx: The patient's family history includes Alcohol abuse in her mother; Diabetes in her maternal grandmother; Hypertension in her maternal grandmother; Lung cancer in her father.  ROS:   Please see the history of present illness.    All other systems reviewed and are negative.   Prior CV studies:   The following studies were reviewed today:  none  Labs/Other Tests and Data Reviewed:    EKG:  No ECG reviewed.  Recent Labs: No results found for requested labs within last 8760 hours.   Recent Lipid Panel No results found for: CHOL, TRIG, HDL, CHOLHDL, LDLCALC, LDLDIRECT   Dated 09/13/16: cholesterol 152, triglycerides 262, HDL 41, LDL 59. A1c 7.3%.  Dated 03/23/16: normal CMET Dated 10/03/17: glucose 200. Otherwise normal CMET and CBC. Cholesterol 170, triglycerides 247, HDL 49, LDL 72, Apo B 101. A1c 7.4%.  Dated 10/02/18: A1c 7.6%. cholesterol 154. Triglycerides 285. HDL 44, LDL 53. CMET and CBC are normal.  Wt Readings from Last 3 Encounters:  01/28/19 195 lb (88.5 kg)  11/23/18 199 lb (90.3 kg)  12/27/17 201 lb (91.2 kg)     Objective:    Vital Signs:  BP 124/68   Pulse 71   Ht 5' 5.5" (1.664 m)   Wt 195 lb (88.5 kg)   BMI 31.96 kg/m    VITAL SIGNS:  reviewed  General: WD WF in NAD HEENT normal Respirations unlabored.  Skin clear. Neuro alert and oriented x 3. Mood normal.   ASSESSMENT & PLAN:    1. CAD with past stenting of the LAD 2008 - Cardiac cath Sept. 2014 showed nonobstructive CAD. She is asymptomatic. Prior Myoview not reliable so will not pursue further evaluation unless symptomatic. Continue medical Rx.  2. HTN - BP is well controlled.   3. HLD -  Continue Crestor. LDL well controlled. Needs to lose weight to help with triglycerides.   4. IDDM - per primary care.   COVID-19 Education: The signs and symptoms  of COVID-19 were discussed with the patient and how to seek care for testing (follow up with PCP or arrange E-visit).  The importance of social distancing was discussed today.  Time:   Today, I have spent 10 minutes with the patient with telehealth technology discussing the above problems.     Medication Adjustments/Labs and Tests Ordered: Current medicines are reviewed at length with the patient today.  Concerns regarding medicines are outlined above.   Tests Ordered: No orders of the defined types were placed in this encounter.   Medication Changes: No orders of the defined types were placed  in this encounter.   Follow Up:  In Person in 6 month(s)  Signed, Deliyah Muckle [place]ique, MD  01/28/2019 9:10 AM    [place]

## 2019-01-28 ENCOUNTER — Encounter: Payer: Self-pay | Admitting: Cardiology

## 2019-01-28 ENCOUNTER — Telehealth (INDEPENDENT_AMBULATORY_CARE_PROVIDER_SITE_OTHER): Payer: PPO | Admitting: Cardiology

## 2019-01-28 VITALS — BP 124/68 | HR 71 | Ht 65.5 in | Wt 195.0 lb

## 2019-01-28 DIAGNOSIS — I2510 Atherosclerotic heart disease of native coronary artery without angina pectoris: Secondary | ICD-10-CM | POA: Diagnosis not present

## 2019-01-28 DIAGNOSIS — I10 Essential (primary) hypertension: Secondary | ICD-10-CM

## 2019-01-28 DIAGNOSIS — E782 Mixed hyperlipidemia: Secondary | ICD-10-CM

## 2019-01-28 NOTE — Patient Instructions (Signed)
Medication Instructions:  Continue same medications *If you need a refill on your cardiac medications before your next appointment, please call your pharmacy*  Lab Work: None ordered   Testing/Procedures: None ordered  Follow-Up: At [facility], you and your health needs are our priority.  As part of our continuing mission to provide you with exceptional heart care, we have created designated Provider Care Teams.  These Care Teams include your primary Cardiologist (physician) and Advanced Practice Providers (APPs -  Physician Assistants and Nurse Practitioners) who all work together to provide you with the care you need, when you need it.  Your next appointment:  6 months    Call in April to schedule July appointment   The format for your next appointment:  Office   Provider:  Dr.Jordan

## 2019-03-04 ENCOUNTER — Ambulatory Visit: Payer: HMO | Attending: Internal Medicine

## 2019-03-04 DIAGNOSIS — Z23 Encounter for immunization: Secondary | ICD-10-CM | POA: Insufficient documentation

## 2019-03-04 NOTE — Progress Notes (Signed)
   Covid-19 Vaccination Clinic  Name:  [name]    MRN: [id number] DOB: 1940/05/25  03/04/2019  [name] was observed post Covid-19 immunization for 15 minutes without incidence. She was provided with Vaccine Information Sheet and instruction to access the V-Safe system.   [name] was instructed to call 911 with any severe reactions post vaccine: Marland Kitchen Difficulty breathing  . Swelling of your face and throat  . A fast heartbeat  . A bad rash all over your body  . Dizziness and weakness    Immunizations Administered    Name Date Dose VIS Date Route   Pfizer COVID-19 Vaccine 03/04/2019  5:27 PM 0.3 mL 01/04/2019 Intramuscular   Manufacturer: [place]   Lot: [id number]   [place]: [id number]

## 2019-03-12 DIAGNOSIS — L821 Other seborrheic keratosis: Secondary | ICD-10-CM | POA: Diagnosis not present

## 2019-03-12 DIAGNOSIS — L918 Other hypertrophic disorders of the skin: Secondary | ICD-10-CM | POA: Diagnosis not present

## 2019-03-12 DIAGNOSIS — L820 Inflamed seborrheic keratosis: Secondary | ICD-10-CM | POA: Diagnosis not present

## 2019-03-12 DIAGNOSIS — D1801 Hemangioma of skin and subcutaneous tissue: Secondary | ICD-10-CM | POA: Diagnosis not present

## 2019-03-12 DIAGNOSIS — L853 Xerosis cutis: Secondary | ICD-10-CM | POA: Diagnosis not present

## 2019-03-12 DIAGNOSIS — L814 Other melanin hyperpigmentation: Secondary | ICD-10-CM | POA: Diagnosis not present

## 2019-03-29 ENCOUNTER — Ambulatory Visit: Payer: HMO | Attending: Internal Medicine

## 2019-03-29 DIAGNOSIS — Z23 Encounter for immunization: Secondary | ICD-10-CM

## 2019-03-29 NOTE — Progress Notes (Signed)
   Covid-19 Vaccination Clinic  Name:  YEILANI WOLLE    MRN: [id number] DOB: September 16, 1940  03/29/2019  Ms. Hestand was observed post Covid-19 immunization for 15 minutes without incident. She was provided with Vaccine Information Sheet and instruction to access the V-Safe system.   Ms. Grange was instructed to call 911 with any severe reactions post vaccine: Marland Kitchen Difficulty breathing  . Swelling of face and throat  . A fast heartbeat  . A bad rash all over body  . Dizziness and weakness   Immunizations Administered    Name Date Dose VIS Date Route   Pfizer COVID-19 Vaccine 03/29/2019  4:11 AM 0.3 mL 01/04/2019 Intramuscular   Manufacturer: [place]   Lot: [id number]   [place]: [id number]

## 2019-04-08 DIAGNOSIS — M1380 Other specified arthritis, unspecified site: Secondary | ICD-10-CM | POA: Diagnosis not present

## 2019-04-08 DIAGNOSIS — D692 Other nonthrombocytopenic purpura: Secondary | ICD-10-CM | POA: Diagnosis not present

## 2019-04-08 DIAGNOSIS — Z794 Long term (current) use of insulin: Secondary | ICD-10-CM | POA: Diagnosis not present

## 2019-04-08 DIAGNOSIS — I2510 Atherosclerotic heart disease of native coronary artery without angina pectoris: Secondary | ICD-10-CM | POA: Diagnosis not present

## 2019-04-08 DIAGNOSIS — F325 Major depressive disorder, single episode, in full remission: Secondary | ICD-10-CM | POA: Diagnosis not present

## 2019-04-08 DIAGNOSIS — I119 Hypertensive heart disease without heart failure: Secondary | ICD-10-CM | POA: Diagnosis not present

## 2019-04-08 DIAGNOSIS — E1151 Type 2 diabetes mellitus with diabetic peripheral angiopathy without gangrene: Secondary | ICD-10-CM | POA: Diagnosis not present

## 2019-04-08 DIAGNOSIS — E1165 Type 2 diabetes mellitus with hyperglycemia: Secondary | ICD-10-CM | POA: Diagnosis not present

## 2019-04-08 DIAGNOSIS — E7800 Pure hypercholesterolemia, unspecified: Secondary | ICD-10-CM | POA: Diagnosis not present

## 2019-04-08 DIAGNOSIS — E669 Obesity, unspecified: Secondary | ICD-10-CM | POA: Diagnosis not present

## 2019-04-08 DIAGNOSIS — Z1331 Encounter for screening for depression: Secondary | ICD-10-CM | POA: Diagnosis not present

## 2019-04-09 ENCOUNTER — Other Ambulatory Visit: Payer: Self-pay | Admitting: *Deleted

## 2019-04-09 DIAGNOSIS — E119 Type 2 diabetes mellitus without complications: Secondary | ICD-10-CM

## 2019-04-09 NOTE — Patient Outreach (Signed)
[facility]) Care Management Chronic Special Needs Program    04/09/2019  Name: [name], DOB: 10-Nov-1940  MRN: [id number]   [name] is enrolled in a chronic special needs plan for diabetes.  Health risk assessment completed previously by client.  Interdisciplinary care plan created from information in electronic medical record.  Client also has history hypertension, CAD.  RN care Bean will send introductory letter with interdisciplinary care plan to primary care provider and client, along with education materials.  Assigned RN care Bean will follow up within 3-4 months.  Goals    .  Acknowledge receipt of [name] will mail Advanced Directives packet and EMMI article "Advanced Directives" Please call RN care Bean at [phone] if any concerns/ issues with completing advanced directives.    . Advanced Care Planning complete by 3 months     Please read over Advanced Directive packet and complete as desired Call with any questions at [phone] and social work referral can be completed if needed.    . Client understands the importance of follow-up with providers by attending scheduled visits     Client saw primary care provider five times in 2020 with most recent 11/01/18. Client saw cardiologist 01/28/19 Please continue to see providers as scheduled and needed    . Client will report no worsening of symptoms related to heart disease within the next 3 months     Continue to follow up with health care providers Choose healthy life style choices including diet (rich in fruits, vegetables and lean proteins, low in salt and simple carbohydrates) and exercise. Talk with your doctor before beginning an exercise program Continue to have labwork checked    . Client will verbalize knowledge of self management of Hypertension as evidences by BP reading of 140/90 or less; or as defined by provider     Blood  pressure 124/68 from 01/28/19 cardiology appointment Take your medications as prescribed Continue to follow up with providers  Please ask your provider " what is my target blood pressure" Monitor your blood pressure and take results to your doctor's appointment You can record blood pressure in [name] calendar- RN care Bean mailed to client. RN care Bean will send to client EMMI education article "Hypertension, what you can do".    . HEMOGLOBIN A1C < 7.0     Most recent Hgb AIC 7.6 on 10/02/18 with goal <7 Take all medications as prescribed Follow up with primary care provider Monitor blood sugar as ordered by provider Hgb AIC level should be checked every 3-6 months Carbohydrate controlled meal planning and portion control Physical activity Check feet daily Eye exam yearly RN care Bean will send to client EMMI education article "Diabetes care check list" RN care Bean placed referral for [facility] pharmacist due to client takes 10 or more medications, pharmacist will call you.    . Maintain timely refills of diabetic medication as prescribed within the year .     Please take medications as prescribed, refill as ordered and do not run out of medication [facility] pharmacist will contact you for any questions you may have     . Obtain annual  Lipid Profile, LDL-C     Lipid profile completed 10/02/18 with triglycerides being elevated LDL=53 per medical record review The goal for LDL is less than 70 mg/ dl as you are at high risk for complications, try to avoid saturated fats, trans-fats and eat more  fiber. Continue to have labwork completed as ordered and follow up with primary care provider    . Obtain Annual Eye (retinal)  Exam      Eye exam completed 10/24/18 Continue to keep and/ or schedule follow up appointment with eye doctor Plan to schedule your eye exam yearly    . Obtain Annual Foot Exam     Foot exam completed 04/20/18 Your doctor should check your feet at least  once per year    . Obtain annual screen for micro albuminuria (urine) , nephropathy (kidney problems)     Annual microalbuminuria completed on 10/05/18 It is important to follow up with primary care provider for yearly physicals and labwork as recommended. It is important for your doctor to check your urine for protein at least every year    . Obtain Hemoglobin A1C at least 2 times per year     Hgb AIC completed 10/02/18 Per medical record review AIC=7.6 Continue to follow up with provider for exams and lab work    . Visit Primary Care Provider or Endocrinologist at least 2 times per year      Per medical record review, client saw primary care provider 5 times in 2020 Please call and schedule yearly physical with primary care provider      Jacqlyn Larsen [facility], [place] Coordinator [phone]

## 2019-04-18 ENCOUNTER — Other Ambulatory Visit: Payer: Self-pay | Admitting: Pharmacist
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
## 2019-04-25 ENCOUNTER — Ambulatory Visit: Payer: Self-pay | Admitting: *Deleted

## 2019-04-25 ENCOUNTER — Encounter: Payer: Self-pay | Admitting: *Deleted

## 2019-04-25 ENCOUNTER — Other Ambulatory Visit: Payer: Self-pay | Admitting: *Deleted

## 2019-04-25 NOTE — Patient Outreach (Addendum)
[facility]) Care Management Chronic Special Needs Program  04/25/2019  Name: [name] DOB: [birth year]  MRN: [id number]  [name] is enrolled in a chronic special needs plan for Diabetes. Chronic Care Management Coordinator telephoned client to review health risk assessment and to develop individualized care plan.  Introduced the chronic care management program, importance of client participation, and taking their care plan to all provider appointments and inpatient facilities.  Reviewed the transition of care process and possible referral to community care management.  Subjective:  RN care manager spoke with client for initial telephone outreach, spoke with client, HIPAA verified, client reports she lives with spouse and she is independent with ADL/ IADL's.  Client reports she received HTA calendar and advanced directives " are somewhere here", client not sure if and when she will complete.  Client states she has neck pain and sees Dr. Nelva Bean "and has injections from time to time", takes over the counter pain relief such as advil "that helps some".  Client reports she saw primary care provider March 2021 and toujeo increased to 80 units and client feels this has already helped her blood sugar levels which are running 105-135 range fasting, client checks once daily before breakfast.  Client states she eats " basically what I want, at my age, I'm not too worried about it", reports her goal is "to lower AIC below 7".  Client reports she may be interested in [employer] so she can start exercising as currently she does not exercise.  Goals    . Client understands the importance of follow-up with providers by attending scheduled visits     Client saw primary care provider five times in 2020 with most recent appointment 04/08/19. Please continue to see providers as scheduled and needed    . Client will report no worsening of symptoms related to heart disease within  the next 12 months     Continue to follow up with health care providers RN care manager reviewed with client importance of choosing healthy life style choices including diet (rich in fruits, vegetables and lean proteins, low in salt and simple carbohydrates) and exercise. Talk with your doctor before beginning an exercise program Continue to have labwork checked    . Client will verbalize improved self management for pain within 12 months     RN care manager reviewed importance of following up with health care provider (primary care provider or Dr. Nelva Bean) for continued management of pain, talk to your doctor about any concerns Take over the counter pain relief medication as ordered by doctor Practice relaxation and try to keep stress managed     . Client will verbalize knowledge of self management of Hypertension as evidences by BP reading of 140/90 or less; or as defined by provider     Blood pressure 124/68 from 01/28/19 cardiology appointment Take your medications as prescribed Continue to follow up with providers  Please ask your provider " what is my target blood pressure" Monitor your blood pressure and take results to your doctor's appointment You can record blood pressure in [place] calendar- client does have on hand.     Marland Kitchen HEMOGLOBIN A1C < 7 (pt-stated)     Most recent Hgb AIC 7.6 on 10/02/18 with goal <7 Take all medications as prescribed Follow up with primary care provider Monitor blood sugar as ordered by provider Hgb AIC level should be checked every 3-6 months Carbohydrate controlled meal planning and portion control Reviewed plate  method Physical activity- Consulting civil engineer provided contact number to client for HTA concierge as she is interested in asking about benefit for Silver Sneakers Check feet daily Eye exam yearly [facility] pharmacist outreached client for medication reconciliation     . Maintain timely refills of diabetic medication as prescribed within  the year .     Please take medications as prescribed, refill as ordered and do not run out of medication Take medications as prescribed. Follow up with your health care provider if you have any questions. Please contact your assigned RN care manager if you have difficulty obtaining medications. Review of electronic medical record medication dispense report indicates client maintains timely refills of diabetic medications.      . Obtain annual  Lipid Profile, LDL-C     Lipid profile completed 10/02/18 with triglycerides being elevated LDL=53 per medical record review The goal for LDL is less than 70 mg/ dl as you are at high risk for complications, try to avoid saturated fats, trans-fats and eat more fiber. Continue to have labwork completed as ordered and follow up with primary care provider    . Obtain Annual Eye (retinal)  Exam      Eye exam completed 10/24/18 Continue to keep and/ or schedule follow up appointment with eye doctor Plan to schedule your eye exam yearly    . Obtain Annual Foot Exam     Foot exam completed 04/20/18 Your doctor should check your feet at least once per year Client states provider checks feet   Plan to schedule a foot exam with your health care provider once every year. Check your skin and feet daily for cuts, bruises, redness, blisters or sore.     . Obtain annual screen for micro albuminuria (urine) , nephropathy (kidney problems)     Annual microalbuminuria completed on 10/05/18 It is important to follow up with primary care provider for yearly physicals and labwork as recommended. It is important for your doctor to check your urine for protein at least every year    . Obtain Hemoglobin A1C at least 2 times per year     Hgb AIC completed 10/02/18 Per medical record review AIC=7.6 Per medical record review Hgb AIC completed on 10/02/18 Diabetes self management actions as follows- Continue to keep your follow up appointments with your provider and have lab  work completed as recommended. Monitor glucose (blood sugar) per health care provider recommendation. Visit health care provider every 3-6 months as directed. It is important to have your Hgb AIC checked every 3-6 months (every 6 months if you are at goal and every 3 months if you are not at goal).      . Visit Primary Care Provider or Endocrinologist at least 2 times per year      Per medical record review, client saw primary care provider 5 times in 2020 and once in 2021 Please follow up with your primary care provider per recommendation       PLAN: RN care manager reviewed contact number for 24 hour nurse advice line, provided RN care manager contact number for any questions. RN care manager mailed successful outreach letter to client's home including updated individualized care plan and consent form. RN care manager faxed today's note to primary care provider with updated individualized care plan. Chronic care management coordinator will outreach client in:  12 months   [place] Case Manager, C-SNP  [phone]

## 2019-05-27 ENCOUNTER — Other Ambulatory Visit: Payer: Self-pay

## 2019-05-27 MED ORDER — LOSARTAN POTASSIUM 25 MG PO TABS: 25.0000 mg | ORAL_TABLET | Freq: Every day | ORAL | 3 refills | Status: DC

## 2019-05-27 MED ORDER — CARVEDILOL 6.25 MG PO TABS: 6.2500 mg | ORAL_TABLET | Freq: Two times a day (BID) | ORAL | 1 refills | Status: DC

## 2019-06-17 DIAGNOSIS — M545 Low back pain: Secondary | ICD-10-CM | POA: Diagnosis not present

## 2019-06-17 DIAGNOSIS — M5416 Radiculopathy, lumbar region: Secondary | ICD-10-CM | POA: Diagnosis not present

## 2019-06-27 DIAGNOSIS — M5136 Other intervertebral disc degeneration, lumbar region: Secondary | ICD-10-CM | POA: Diagnosis not present

## 2019-07-10 DIAGNOSIS — M5416 Radiculopathy, lumbar region: Secondary | ICD-10-CM | POA: Diagnosis not present

## 2019-07-10 DIAGNOSIS — M545 Low back pain: Secondary | ICD-10-CM | POA: Diagnosis not present

## 2019-07-25 DIAGNOSIS — M5136 Other intervertebral disc degeneration, lumbar region: Secondary | ICD-10-CM | POA: Diagnosis not present

## 2019-08-15 DIAGNOSIS — M545 Low back pain: Secondary | ICD-10-CM | POA: Diagnosis not present

## 2019-08-17 NOTE — Progress Notes (Signed)
[name] Date of Birth: May 01, 1940 Medical Record #[id number]  History of Present Illness: [name] is seen for follow up CAD.  She has known CAD with past Taxus stent to the LAD in 2008, HTN, HLD, IDDM and GERD. In September 2014 she had an abnormal Myoview study. Cardiac cath showed nonobstructive CAD.  In October 2018 she had vascular screening showing mild nonobstructive carotid plaque. Abdominal US showed no aneurysm. LE arterial dopplers were normal.   On follow up today she is doing well.   She denies any chest pain or SOB.  Her major complaint is of back pain. Is seeing Dr Nelva Bush and MRI is scheduled. She has had a couple of injections.   Current Outpatient Medications  Medication Sig Dispense Refill   aspirin EC 81 MG tablet Take 81 mg by mouth daily.     Canagliflozin (INVOKANA) 300 MG TABS Take by mouth.     carvedilol (COREG) 6.25 MG tablet Take 1 tablet (6.25 mg total) by mouth 2 (two) times daily. PLEASE SCHEDULE AN APPT FOR FUTURE REFILLS 180 tablet 1   carvedilol (COREG) 6.25 MG tablet Take 1 tablet (6.25 mg total) by mouth 2 (two) times daily. PLEASE SCHEDULE AN APPT FOR FUTURE REFILLS 180 tablet 1   clopidogrel (PLAVIX) 75 MG tablet take 1 tablet by mouth once daily 30 tablet 5   estradiol (VIVELLE-DOT) 0.05 MG/24HR patch Place 1 patch (0.05 mg total) onto the skin 2 (two) times a week. 24 patch 4   Ibuprofen-diphenhydrAMINE Cit (ADVIL PM PO) Take by mouth at bedtime.     losartan (COZAAR) 25 MG tablet Take 1 tablet (25 mg total) by mouth daily. 90 tablet 3   MAGNESIUM PO Take by mouth.     METFORMIN HCL PO Take 500 mg by mouth 2 (two) times daily.       omeprazole (PRILOSEC) 20 MG capsule Take 20 mg by mouth daily.       ONETOUCH VERIO test strip 1 strip by Other route daily. Use 1 strip to check glucose once a day  0   rosuvastatin (CRESTOR) 10 MG tablet Take 10 mg by mouth daily.       TOUJEO SOLOSTAR 300 UNIT/ML SOPN Inject 80 Units as  directed daily.   0   Current Facility-Administered Medications  Medication Dose Route Frequency Provider Last Rate Last Admin   0.9 %  sodium chloride infusion  500 mL Intravenous Continuous Irene Shipper, MD        Allergies  Allergen Reactions   Erythromycin Other (See Comments)    Mouth ulcers    Past Medical History:  Diagnosis Date   CAD (coronary artery disease)    Cataract    Chronic cough    Diabetes mellitus    Endometriosis    GERD (gastroesophageal reflux disease)    History of heart artery stent    Hyperlipidemia    Hypertension    OA (osteoarthritis)    Obesity     Past Surgical History:  Procedure Laterality Date   ANGIOPLASTY     APPENDECTOMY     BREAST ENHANCEMENT SURGERY  age 63   CARDIAC CATHETERIZATION  07/17/2006   CARDIOVASCULAR STRESS TEST  03/31/2009   EF 72%   CATARACT EXTRACTION  2018   CHOLECYSTECTOMY     heart stent     LEFT HEART CATHETERIZATION WITH CORONARY ANGIOGRAM N/A 10/09/2012   Procedure: LEFT HEART CATHETERIZATION WITH CORONARY ANGIOGRAM;  Surgeon: Neomi Laidler M Martinique, MD;  Location: [facility] CATH LAB;  Service: Cardiovascular;  Laterality: N/A;   TONSILLECTOMY     VAGINAL HYSTERECTOMY  1973    Social History   Tobacco Use  Smoking Status Never Smoker  Smokeless Tobacco Never Used    Social History   Substance and Sexual Activity  Alcohol Use Yes   Alcohol/week: 0.0 standard drinks   Comment: Rare    Family History  Problem Relation Age of Onset   Alcohol abuse Mother    Diabetes Maternal Grandmother    Hypertension Maternal Grandmother    Lung cancer Father     Review of Systems: The review of systems is per the HPI.  All other systems were reviewed and are negative.  Physical Exam: BP 118/70    Pulse 73    Ht 5\' 5"  (1.651 m)    Wt 197 lb (89.4 kg)    SpO2 94%    BMI 32.78 kg/m  GENERAL:  Well appearing obese WF in NAD HEENT:  PERRL, EOMI, sclera are clear. Oropharynx is clear. NECK:  No  jugular venous distention, carotid upstroke brisk and symmetric, no bruits, no thyromegaly or adenopathy LUNGS:  Clear to auscultation bilaterally CHEST:  Unremarkable HEART:  RRR,  PMI not displaced or sustained,S1 and S2 within normal limits, no S3, no S4: no clicks, no rubs, no murmurs ABD:  Soft, nontender. BS +, no masses or bruits. No hepatomegaly, no splenomegaly EXT:  2 + pulses throughout, no edema, no cyanosis no clubbing SKIN:  Warm and dry.  No rashes. Scaly plaques on LE.  NEURO:  Alert and oriented x 3. Cranial nerves II through XII intact. PSYCH:  Cognitively intact   LABORATORY DATA:  Dated 09/13/16: cholesterol 152, triglycerides 262, HDL 41, LDL 59. A1c 7.3%.  Dated 03/23/16: normal CMET Dated 10/03/17: glucose 200. Otherwise normal CMET and CBC. Cholesterol 170, triglycerides 247, HDL 49, LDL 72, Apo B 101. A1c 7.4%.  Dated 10/02/18: A1c 7.6%. cholesterol 154. Triglycerides 285. HDL 44, LDL 53. CMET and CBC are normal. Dated 04/08/19: A1c 7.7%. CMET normal.  Ecg today shows NSR rate 73, LAD.  I have personally reviewed and interpreted this study.  Assessment / Plan: 1. CAD with past stenting of the LAD 2008 - Cardiac cath Sept. 2014 showed nonobstructive CAD. She is asymptomatic. Prior Myoview not reliable so will not pursue further evaluation unless symptomatic. Continue medical Rx.   2. HTN - BP is well controlled.   3. HLD -  Continue Crestor. LDL well controlled. Follow up labs with primary care.   4. IDDM - per primary care. Last A1c 7.7%  I will follow up in one year.

## 2019-08-20 ENCOUNTER — Other Ambulatory Visit: Payer: Self-pay

## 2019-08-20 ENCOUNTER — Ambulatory Visit: Payer: HMO | Admitting: Cardiology

## 2019-08-20 ENCOUNTER — Encounter: Payer: Self-pay | Admitting: Cardiology

## 2019-08-20 VITALS — BP 118/70 | HR 73 | Ht 65.0 in | Wt 197.0 lb

## 2019-08-20 DIAGNOSIS — I25118 Atherosclerotic heart disease of native coronary artery with other forms of angina pectoris: Secondary | ICD-10-CM

## 2019-08-20 DIAGNOSIS — I10 Essential (primary) hypertension: Secondary | ICD-10-CM | POA: Diagnosis not present

## 2019-08-20 DIAGNOSIS — E782 Mixed hyperlipidemia: Secondary | ICD-10-CM | POA: Diagnosis not present

## 2019-08-29 DIAGNOSIS — M545 Low back pain: Secondary | ICD-10-CM | POA: Diagnosis not present

## 2019-09-06 DIAGNOSIS — M545 Low back pain: Secondary | ICD-10-CM | POA: Diagnosis not present

## 2019-09-06 DIAGNOSIS — M48062 Spinal stenosis, lumbar region with neurogenic claudication: Secondary | ICD-10-CM | POA: Diagnosis not present

## 2019-09-26 DIAGNOSIS — M48062 Spinal stenosis, lumbar region with neurogenic claudication: Secondary | ICD-10-CM | POA: Diagnosis not present

## 2019-10-02 DIAGNOSIS — E785 Hyperlipidemia, unspecified: Secondary | ICD-10-CM | POA: Diagnosis not present

## 2019-10-02 DIAGNOSIS — E1151 Type 2 diabetes mellitus with diabetic peripheral angiopathy without gangrene: Secondary | ICD-10-CM | POA: Diagnosis not present

## 2019-10-09 DIAGNOSIS — E1159 Type 2 diabetes mellitus with other circulatory complications: Secondary | ICD-10-CM | POA: Diagnosis not present

## 2019-10-09 DIAGNOSIS — F331 Major depressive disorder, recurrent, moderate: Secondary | ICD-10-CM | POA: Diagnosis not present

## 2019-10-09 DIAGNOSIS — E7800 Pure hypercholesterolemia, unspecified: Secondary | ICD-10-CM | POA: Diagnosis not present

## 2019-10-09 DIAGNOSIS — I2510 Atherosclerotic heart disease of native coronary artery without angina pectoris: Secondary | ICD-10-CM | POA: Diagnosis not present

## 2019-10-09 DIAGNOSIS — Z0000 Encounter for general adult medical examination without abnormal findings: Secondary | ICD-10-CM | POA: Diagnosis not present

## 2019-10-09 DIAGNOSIS — E1165 Type 2 diabetes mellitus with hyperglycemia: Secondary | ICD-10-CM | POA: Diagnosis not present

## 2019-10-09 DIAGNOSIS — Z23 Encounter for immunization: Secondary | ICD-10-CM | POA: Diagnosis not present

## 2019-10-09 DIAGNOSIS — E1151 Type 2 diabetes mellitus with diabetic peripheral angiopathy without gangrene: Secondary | ICD-10-CM | POA: Diagnosis not present

## 2019-10-09 DIAGNOSIS — R82998 Other abnormal findings in urine: Secondary | ICD-10-CM | POA: Diagnosis not present

## 2019-10-09 DIAGNOSIS — Z794 Long term (current) use of insulin: Secondary | ICD-10-CM | POA: Diagnosis not present

## 2019-10-09 DIAGNOSIS — I119 Hypertensive heart disease without heart failure: Secondary | ICD-10-CM | POA: Diagnosis not present

## 2019-10-09 DIAGNOSIS — D692 Other nonthrombocytopenic purpura: Secondary | ICD-10-CM | POA: Diagnosis not present

## 2019-10-09 DIAGNOSIS — M1380 Other specified arthritis, unspecified site: Secondary | ICD-10-CM | POA: Diagnosis not present

## 2019-10-23 DIAGNOSIS — M545 Low back pain: Secondary | ICD-10-CM | POA: Diagnosis not present

## 2019-10-23 DIAGNOSIS — M5136 Other intervertebral disc degeneration, lumbar region: Secondary | ICD-10-CM | POA: Diagnosis not present

## 2019-10-23 DIAGNOSIS — M48062 Spinal stenosis, lumbar region with neurogenic claudication: Secondary | ICD-10-CM | POA: Diagnosis not present

## 2019-10-23 DIAGNOSIS — M5030 Other cervical disc degeneration, unspecified cervical region: Secondary | ICD-10-CM | POA: Diagnosis not present

## 2019-10-23 DIAGNOSIS — I2510 Atherosclerotic heart disease of native coronary artery without angina pectoris: Secondary | ICD-10-CM | POA: Diagnosis not present

## 2019-10-23 DIAGNOSIS — D6869 Other thrombophilia: Secondary | ICD-10-CM | POA: Diagnosis not present

## 2019-10-29 DIAGNOSIS — H26492 Other secondary cataract, left eye: Secondary | ICD-10-CM | POA: Diagnosis not present

## 2019-10-29 DIAGNOSIS — H43813 Vitreous degeneration, bilateral: Secondary | ICD-10-CM | POA: Diagnosis not present

## 2019-10-29 DIAGNOSIS — H524 Presbyopia: Secondary | ICD-10-CM | POA: Diagnosis not present

## 2019-10-29 DIAGNOSIS — E113292 Type 2 diabetes mellitus with mild nonproliferative diabetic retinopathy without macular edema, left eye: Secondary | ICD-10-CM | POA: Diagnosis not present

## 2019-11-12 DIAGNOSIS — M5030 Other cervical disc degeneration, unspecified cervical region: Secondary | ICD-10-CM | POA: Diagnosis not present

## 2019-11-28 ENCOUNTER — Encounter: Payer: PPO | Admitting: Obstetrics and Gynecology

## 2019-11-28 ENCOUNTER — Other Ambulatory Visit: Payer: Self-pay | Admitting: *Deleted

## 2019-11-28 NOTE — Patient Outreach (Signed)
  [facility]) Care Management Chronic Special Needs Program    11/28/2019  Name: [name], DOB: 1940-03-08  MRN: [id number]   [name] is enrolled in a chronic special needs plan for Diabetes.  [employer] care management team has assumed care and services for this member.  Case closed by [facility] care management.  Case closed  [name] [facility], BSN [place], [place]

## 2020-01-27 ENCOUNTER — Ambulatory Visit: Payer: HMO | Admitting: *Deleted

## 2020-02-18 DIAGNOSIS — E113292 Type 2 diabetes mellitus with mild nonproliferative diabetic retinopathy without macular edema, left eye: Secondary | ICD-10-CM | POA: Diagnosis not present

## 2020-04-08 DIAGNOSIS — D692 Other nonthrombocytopenic purpura: Secondary | ICD-10-CM | POA: Diagnosis not present

## 2020-04-08 DIAGNOSIS — F331 Major depressive disorder, recurrent, moderate: Secondary | ICD-10-CM | POA: Diagnosis not present

## 2020-04-08 DIAGNOSIS — Z1331 Encounter for screening for depression: Secondary | ICD-10-CM | POA: Diagnosis not present

## 2020-04-08 DIAGNOSIS — E1165 Type 2 diabetes mellitus with hyperglycemia: Secondary | ICD-10-CM | POA: Diagnosis not present

## 2020-04-08 DIAGNOSIS — I119 Hypertensive heart disease without heart failure: Secondary | ICD-10-CM | POA: Diagnosis not present

## 2020-04-08 DIAGNOSIS — Z1339 Encounter for screening examination for other mental health and behavioral disorders: Secondary | ICD-10-CM | POA: Diagnosis not present

## 2020-04-08 DIAGNOSIS — Z794 Long term (current) use of insulin: Secondary | ICD-10-CM | POA: Diagnosis not present

## 2020-04-08 DIAGNOSIS — Z23 Encounter for immunization: Secondary | ICD-10-CM | POA: Diagnosis not present

## 2020-04-08 DIAGNOSIS — M47892 Other spondylosis, cervical region: Secondary | ICD-10-CM | POA: Diagnosis not present

## 2020-04-08 DIAGNOSIS — E669 Obesity, unspecified: Secondary | ICD-10-CM | POA: Diagnosis not present

## 2020-04-08 DIAGNOSIS — E7800 Pure hypercholesterolemia, unspecified: Secondary | ICD-10-CM | POA: Diagnosis not present

## 2020-04-08 DIAGNOSIS — E1151 Type 2 diabetes mellitus with diabetic peripheral angiopathy without gangrene: Secondary | ICD-10-CM | POA: Diagnosis not present

## 2020-04-08 DIAGNOSIS — E1159 Type 2 diabetes mellitus with other circulatory complications: Secondary | ICD-10-CM | POA: Diagnosis not present

## 2020-04-29 DIAGNOSIS — B078 Other viral warts: Secondary | ICD-10-CM | POA: Diagnosis not present

## 2020-04-29 DIAGNOSIS — D485 Neoplasm of uncertain behavior of skin: Secondary | ICD-10-CM | POA: Diagnosis not present

## 2020-04-29 DIAGNOSIS — L820 Inflamed seborrheic keratosis: Secondary | ICD-10-CM | POA: Diagnosis not present

## 2020-04-29 DIAGNOSIS — L821 Other seborrheic keratosis: Secondary | ICD-10-CM | POA: Diagnosis not present

## 2020-06-23 ENCOUNTER — Other Ambulatory Visit: Payer: Self-pay | Admitting: Cardiology

## 2020-06-24 ENCOUNTER — Other Ambulatory Visit: Payer: Self-pay | Admitting: Cardiology

## 2020-07-06 ENCOUNTER — Other Ambulatory Visit: Payer: Self-pay | Admitting: Cardiology

## 2020-07-30 ENCOUNTER — Other Ambulatory Visit: Payer: Self-pay | Admitting: Cardiology

## 2020-07-31 ENCOUNTER — Other Ambulatory Visit: Payer: Self-pay | Admitting: Cardiology

## 2020-08-27 NOTE — Progress Notes (Signed)
[name] Date of Birth: 12/24/1940 Medical Record [id number]  History of Present Illness: [name] is seen for follow up CAD.  She has known CAD with past Taxus stent to the LAD in 2008, HTN, HLD, IDDM and GERD. In September 2014 she had an abnormal Myoview study. Cardiac cath showed nonobstructive CAD.  In October 2018 she had vascular screening showing mild nonobstructive carotid plaque. Abdominal US showed no aneurysm. LE arterial dopplers were normal.   On follow up today she is doing well from a cardiac standpoint.   She denies any chest pain or SOB.  She has a little dizziness. She is still limited by back and cervical pain. She is in the process of moving now.   Current Outpatient Medications  Medication Sig Dispense Refill   aspirin EC 81 MG tablet Take 81 mg by mouth daily.     canagliflozin (INVOKANA) 300 MG TABS tablet Take by mouth.     carvedilol (COREG) 6.25 MG tablet TAKE 1 TABLET BY MOUTH TWICE DAILY 180 tablet 3   estradiol (VIVELLE-DOT) 0.05 MG/24HR patch Place 1 patch (0.05 mg total) onto the skin 2 (two) times a week. 24 patch 4   Ibuprofen-diphenhydrAMINE Cit (ADVIL PM PO) Take by mouth at bedtime.     MAGNESIUM PO Take by mouth.     METFORMIN HCL PO Take 500 mg by mouth 2 (two) times daily.       omeprazole (PRILOSEC) 20 MG capsule Take 20 mg by mouth daily.       ONETOUCH VERIO test strip 1 strip by Other route daily. Use 1 strip to check glucose once a day  0   rosuvastatin (CRESTOR) 10 MG tablet Take 10 mg by mouth daily.       TOUJEO SOLOSTAR 300 UNIT/ML SOPN Inject 80 Units as directed daily.   0   clopidogrel (PLAVIX) 75 MG tablet Take 1 tablet (75 mg total) by mouth daily. 90 tablet 3   losartan (COZAAR) 25 MG tablet Take 1 tablet (25 mg total) by mouth daily. 90 tablet 3   Current Facility-Administered Medications  Medication Dose Route Frequency Provider Last Rate Last Admin   0.9 %  sodium chloride infusion  500 mL Intravenous Continuous  Irene Shipper, MD        Allergies  Allergen Reactions   Erythromycin Other (See Comments)    Mouth ulcers    Past Medical History:  Diagnosis Date   CAD (coronary artery disease)    Cataract    Chronic cough    Diabetes mellitus    Endometriosis    GERD (gastroesophageal reflux disease)    History of heart artery stent    Hyperlipidemia    Hypertension    OA (osteoarthritis)    Obesity     Past Surgical History:  Procedure Laterality Date   ANGIOPLASTY     APPENDECTOMY     BREAST ENHANCEMENT SURGERY  age 80   CARDIAC CATHETERIZATION  07/17/2006   CARDIOVASCULAR STRESS TEST  03/31/2009   EF 72%   CATARACT EXTRACTION  2018   CHOLECYSTECTOMY     heart stent     LEFT HEART CATHETERIZATION WITH CORONARY ANGIOGRAM N/A 10/09/2012   Procedure: LEFT HEART CATHETERIZATION WITH CORONARY ANGIOGRAM;  Surgeon: Namiko Pritts M Martinique, MD;  Location: [facility] CATH LAB;  Service: Cardiovascular;  Laterality: N/A;   TONSILLECTOMY     VAGINAL HYSTERECTOMY  1973    Social History   Tobacco Use  Smoking Status Never  Smokeless Tobacco Never    Social History   Substance and Sexual Activity  Alcohol Use Yes   Alcohol/week: 0.0 standard drinks   Comment: Rare    Family History  Problem Relation Age of Onset   Alcohol abuse Mother    Diabetes Maternal Grandmother    Hypertension Maternal Grandmother    Lung cancer Father     Review of Systems: The review of systems is per the HPI.  All other systems were reviewed and are negative.  Physical Exam: BP 140/60   Pulse (!) 59   Ht '5\' 5"'$  (1.651 m)   Wt 195 lb (88.5 kg)   SpO2 95%   BMI 32.45 kg/m  GENERAL:  Well appearing obese WF in NAD HEENT:  PERRL, EOMI, sclera are clear. Oropharynx is clear. NECK:  No jugular venous distention, carotid upstroke brisk and symmetric, no bruits, no thyromegaly or adenopathy LUNGS:  Clear to auscultation bilaterally CHEST:  Unremarkable HEART:  RRR,  PMI not displaced or sustained,S1 and S2 within  normal limits, no S3, no S4: no clicks, no rubs, no murmurs ABD:  Soft, nontender. BS +, no masses or bruits. No hepatomegaly, no splenomegaly EXT:  2 + pulses throughout, no edema, no cyanosis no clubbing SKIN:  Warm and dry.  No rashes. Scaly plaques on LE.  NEURO:  Alert and oriented x 3. Cranial nerves II through XII intact. PSYCH:  Cognitively intact   LABORATORY DATA:  Dated 09/13/16: cholesterol 152, triglycerides 262, HDL 41, LDL 59. A1c 7.3%.  Dated 03/23/16: normal CMET Dated 10/03/17: glucose 200. Otherwise normal CMET and CBC. Cholesterol 170, triglycerides 247, HDL 49, LDL 72, Apo B 101. A1c 7.4%.  Dated 10/02/18: A1c 7.6%. cholesterol 154. Triglycerides 285. HDL 44, LDL 53. CMET and CBC are normal. Dated 04/08/19: A1c 7.7%. CMET normal. Dated 10/02/19: cholesterol 152, triglycerides 250, HDL 47, LDL 55. CMET normal Dated 04/08/20: A1c 7.2%  Ecg today shows NSR rate 59. First degree AV block.  I have personally reviewed and interpreted this study.  Assessment / Plan: 1. CAD with past stenting of the LAD 2008 - Cardiac cath Sept. 2014 showed nonobstructive CAD. She is asymptomatic. Prior Myoview not reliable so will not pursue further evaluation unless symptomatic. Continue medical Rx.   2. HTN - BP is well controlled. Rx refilled.   3. HLD -  Continue Crestor. LDL well controlled. Elevated triglycerides with DM and obesity. Encourage weight loss. Labs followed by PCP.   4. IDDM - per primary care. Last A1c 7.2%  I will follow up in one year.

## 2020-08-28 ENCOUNTER — Other Ambulatory Visit: Payer: Self-pay | Admitting: Cardiology

## 2020-09-01 ENCOUNTER — Ambulatory Visit: Payer: HMO | Admitting: Cardiology

## 2020-09-01 ENCOUNTER — Other Ambulatory Visit: Payer: Self-pay

## 2020-09-01 ENCOUNTER — Encounter: Payer: Self-pay | Admitting: Cardiology

## 2020-09-01 VITALS — BP 140/60 | HR 59 | Ht 65.0 in | Wt 195.0 lb

## 2020-09-01 DIAGNOSIS — I25118 Atherosclerotic heart disease of native coronary artery with other forms of angina pectoris: Secondary | ICD-10-CM | POA: Diagnosis not present

## 2020-09-01 DIAGNOSIS — I10 Essential (primary) hypertension: Secondary | ICD-10-CM | POA: Diagnosis not present

## 2020-09-01 DIAGNOSIS — E782 Mixed hyperlipidemia: Secondary | ICD-10-CM | POA: Diagnosis not present

## 2020-09-01 MED ORDER — CLOPIDOGREL BISULFATE 75 MG PO TABS: 75.0000 mg | ORAL_TABLET | Freq: Every day | ORAL | 3 refills | Status: DC

## 2020-09-01 MED ORDER — LOSARTAN POTASSIUM 25 MG PO TABS: 25.0000 mg | ORAL_TABLET | Freq: Every day | ORAL | 3 refills | Status: DC

## 2020-10-14 DIAGNOSIS — E785 Hyperlipidemia, unspecified: Secondary | ICD-10-CM | POA: Diagnosis not present

## 2020-10-14 DIAGNOSIS — E1165 Type 2 diabetes mellitus with hyperglycemia: Secondary | ICD-10-CM | POA: Diagnosis not present

## 2020-10-16 DIAGNOSIS — E119 Type 2 diabetes mellitus without complications: Secondary | ICD-10-CM | POA: Diagnosis not present

## 2020-10-16 DIAGNOSIS — H04123 Dry eye syndrome of bilateral lacrimal glands: Secondary | ICD-10-CM | POA: Diagnosis not present

## 2020-10-16 DIAGNOSIS — H43813 Vitreous degeneration, bilateral: Secondary | ICD-10-CM | POA: Diagnosis not present

## 2020-10-20 ENCOUNTER — Telehealth: Payer: Self-pay | Admitting: Cardiology

## 2020-10-20 MED ORDER — LOSARTAN POTASSIUM 25 MG PO TABS: 25.0000 mg | ORAL_TABLET | Freq: Every day | ORAL | 3 refills | Status: DC

## 2020-10-20 MED ORDER — CARVEDILOL 6.25 MG PO TABS: 6.2500 mg | ORAL_TABLET | Freq: Two times a day (BID) | ORAL | 3 refills | Status: DC

## 2020-10-20 MED ORDER — CLOPIDOGREL BISULFATE 75 MG PO TABS: 75.0000 mg | ORAL_TABLET | Freq: Every day | ORAL | 3 refills | Status: DC

## 2020-10-20 NOTE — Telephone Encounter (Signed)
*  STAT* If patient is at the pharmacy, call can be transferred to refill team.   1. Which medications need to be refilled? (please list name of each medication and dose if known) carvedilol (COREG) 6.25 MG tablet clopidogrel (PLAVIX) 75 MG tablet losartan (COZAAR) 25 MG tablet  2. Which pharmacy/location (including street and city if local pharmacy) is medication to be sent to? [facility] - [place], [place] - [place] [address]  3. Do they need a 30 day or 90 day supply? [place]

## 2020-10-20 NOTE — Telephone Encounter (Signed)
Refills has been sent to the pharmacy. 

## 2020-10-21 DIAGNOSIS — I2510 Atherosclerotic heart disease of native coronary artery without angina pectoris: Secondary | ICD-10-CM | POA: Diagnosis not present

## 2020-10-21 DIAGNOSIS — Z1212 Encounter for screening for malignant neoplasm of rectum: Secondary | ICD-10-CM | POA: Diagnosis not present

## 2020-10-21 DIAGNOSIS — E1165 Type 2 diabetes mellitus with hyperglycemia: Secondary | ICD-10-CM | POA: Diagnosis not present

## 2020-10-21 DIAGNOSIS — Z794 Long term (current) use of insulin: Secondary | ICD-10-CM | POA: Diagnosis not present

## 2020-10-21 DIAGNOSIS — D692 Other nonthrombocytopenic purpura: Secondary | ICD-10-CM | POA: Diagnosis not present

## 2020-10-21 DIAGNOSIS — E1151 Type 2 diabetes mellitus with diabetic peripheral angiopathy without gangrene: Secondary | ICD-10-CM | POA: Diagnosis not present

## 2020-10-21 DIAGNOSIS — F331 Major depressive disorder, recurrent, moderate: Secondary | ICD-10-CM | POA: Diagnosis not present

## 2020-10-21 DIAGNOSIS — I119 Hypertensive heart disease without heart failure: Secondary | ICD-10-CM | POA: Diagnosis not present

## 2020-10-21 DIAGNOSIS — E7800 Pure hypercholesterolemia, unspecified: Secondary | ICD-10-CM | POA: Diagnosis not present

## 2020-10-21 DIAGNOSIS — Z79890 Hormone replacement therapy (postmenopausal): Secondary | ICD-10-CM | POA: Diagnosis not present

## 2020-10-21 DIAGNOSIS — Z23 Encounter for immunization: Secondary | ICD-10-CM | POA: Diagnosis not present

## 2020-10-21 DIAGNOSIS — Z0000 Encounter for general adult medical examination without abnormal findings: Secondary | ICD-10-CM | POA: Diagnosis not present

## 2020-10-21 DIAGNOSIS — R82998 Other abnormal findings in urine: Secondary | ICD-10-CM | POA: Diagnosis not present

## 2020-10-21 DIAGNOSIS — E669 Obesity, unspecified: Secondary | ICD-10-CM | POA: Diagnosis not present

## 2020-10-21 DIAGNOSIS — E1159 Type 2 diabetes mellitus with other circulatory complications: Secondary | ICD-10-CM | POA: Diagnosis not present

## 2020-10-23 DIAGNOSIS — I119 Hypertensive heart disease without heart failure: Secondary | ICD-10-CM | POA: Diagnosis not present

## 2020-10-23 DIAGNOSIS — E1151 Type 2 diabetes mellitus with diabetic peripheral angiopathy without gangrene: Secondary | ICD-10-CM | POA: Diagnosis not present

## 2020-10-23 DIAGNOSIS — E7800 Pure hypercholesterolemia, unspecified: Secondary | ICD-10-CM | POA: Diagnosis not present

## 2020-10-23 DIAGNOSIS — I2510 Atherosclerotic heart disease of native coronary artery without angina pectoris: Secondary | ICD-10-CM | POA: Diagnosis not present

## 2020-10-29 DIAGNOSIS — Z1231 Encounter for screening mammogram for malignant neoplasm of breast: Secondary | ICD-10-CM | POA: Diagnosis not present

## 2020-11-23 DIAGNOSIS — I2510 Atherosclerotic heart disease of native coronary artery without angina pectoris: Secondary | ICD-10-CM | POA: Diagnosis not present

## 2020-11-23 DIAGNOSIS — E7800 Pure hypercholesterolemia, unspecified: Secondary | ICD-10-CM | POA: Diagnosis not present

## 2020-11-23 DIAGNOSIS — I119 Hypertensive heart disease without heart failure: Secondary | ICD-10-CM | POA: Diagnosis not present

## 2020-11-23 DIAGNOSIS — E1151 Type 2 diabetes mellitus with diabetic peripheral angiopathy without gangrene: Secondary | ICD-10-CM | POA: Diagnosis not present

## 2021-01-22 DIAGNOSIS — E7800 Pure hypercholesterolemia, unspecified: Secondary | ICD-10-CM | POA: Diagnosis not present

## 2021-01-22 DIAGNOSIS — I119 Hypertensive heart disease without heart failure: Secondary | ICD-10-CM | POA: Diagnosis not present

## 2021-01-22 DIAGNOSIS — I2510 Atherosclerotic heart disease of native coronary artery without angina pectoris: Secondary | ICD-10-CM | POA: Diagnosis not present

## 2021-01-26 ENCOUNTER — Other Ambulatory Visit: Payer: Self-pay

## 2021-01-26 ENCOUNTER — Encounter (HOSPITAL_BASED_OUTPATIENT_CLINIC_OR_DEPARTMENT_OTHER): Payer: Self-pay | Admitting: Emergency Medicine

## 2021-01-26 ENCOUNTER — Emergency Department (HOSPITAL_BASED_OUTPATIENT_CLINIC_OR_DEPARTMENT_OTHER)
Admission: EM | Admit: 2021-01-26 | Discharge: 2021-01-26 | Disposition: A | Discharge status: Home / Self Care | Payer: HMO | Attending: Emergency Medicine | Admitting: Emergency Medicine

## 2021-01-26 ENCOUNTER — Emergency Department (HOSPITAL_BASED_OUTPATIENT_CLINIC_OR_DEPARTMENT_OTHER): Payer: HMO

## 2021-01-26 DIAGNOSIS — Z7902 Long term (current) use of antithrombotics/antiplatelets: Secondary | ICD-10-CM | POA: Diagnosis not present

## 2021-01-26 DIAGNOSIS — R197 Diarrhea, unspecified: Secondary | ICD-10-CM | POA: Insufficient documentation

## 2021-01-26 DIAGNOSIS — Z20822 Contact with and (suspected) exposure to covid-19: Secondary | ICD-10-CM | POA: Diagnosis not present

## 2021-01-26 DIAGNOSIS — Z7982 Long term (current) use of aspirin: Secondary | ICD-10-CM | POA: Diagnosis not present

## 2021-01-26 DIAGNOSIS — R112 Nausea with vomiting, unspecified: Secondary | ICD-10-CM | POA: Diagnosis not present

## 2021-01-26 DIAGNOSIS — J9811 Atelectasis: Secondary | ICD-10-CM | POA: Diagnosis not present

## 2021-01-26 DIAGNOSIS — I700 Atherosclerosis of aorta: Secondary | ICD-10-CM | POA: Diagnosis not present

## 2021-01-26 LAB — COMPREHENSIVE METABOLIC PANEL
ALT: 29 U/L (ref 0–44)
AST: 32 U/L (ref 15–41)
Albumin: 4.6 g/dL (ref 3.5–5.0)
Alkaline Phosphatase: 70 U/L (ref 38–126)
Anion gap: 12 (ref 5–15)
BUN: 18 mg/dL (ref 8–23)
CO2: 23 mmol/L (ref 22–32)
Calcium: 9.1 mg/dL (ref 8.9–10.3)
Chloride: 104 mmol/L (ref 98–111)
Creatinine, Ser: 0.53 mg/dL (ref 0.44–1.00)
GFR, Estimated: >60 mL/min (ref >60)
Glucose, Bld: 178 mg/dL — ABNORMAL HIGH (ref 70–99)
Potassium: 4 mmol/L (ref 3.5–5.1)
Sodium: 139 mmol/L (ref 135–145)
Total Bilirubin: 2.1 mg/dL — ABNORMAL HIGH (ref 0.3–1.2)
Total Protein: 8.2 g/dL — ABNORMAL HIGH (ref 6.5–8.1)

## 2021-01-26 LAB — URINALYSIS, ROUTINE W REFLEX MICROSCOPIC
Bilirubin Urine: NEGATIVE
Glucose, UA: >=500 mg/dL — AB
Ketones, ur: 15 mg/dL — AB
Leukocytes,Ua: NEGATIVE
Nitrite: NEGATIVE
Protein, ur: NEGATIVE mg/dL
Specific Gravity, Urine: >1.03 — ABNORMAL HIGH (ref 1.005–1.030)
pH: 6 (ref 5.0–8.0)

## 2021-01-26 LAB — CBC
HCT: 47.4 % — ABNORMAL HIGH (ref 36.0–46.0)
Hemoglobin: 15.4 g/dL — ABNORMAL HIGH (ref 12.0–15.0)
MCH: 28.2 pg (ref 26.0–34.0)
MCHC: 32.5 g/dL (ref 30.0–36.0)
MCV: 86.8 fL (ref 80.0–100.0)
Platelets: 236 10*3/uL (ref 150–400)
RBC: 5.46 MIL/uL — ABNORMAL HIGH (ref 3.87–5.11)
RDW: 15 % (ref 11.5–15.5)
WBC: 10.9 10*3/uL — ABNORMAL HIGH (ref 4.0–10.5)
nRBC: 0 % (ref 0.0–0.2)

## 2021-01-26 LAB — RESP PANEL BY RT-PCR (FLU A&B, COVID) ARPGX2
Influenza A by PCR: NEGATIVE
Influenza B by PCR: NEGATIVE
SARS Coronavirus 2 by RT PCR: NEGATIVE

## 2021-01-26 LAB — URINALYSIS, MICROSCOPIC (REFLEX)

## 2021-01-26 LAB — LIPASE, BLOOD: Lipase: 23 U/L (ref 11–51)

## 2021-01-26 MED ORDER — ONDANSETRON HCL 4 MG/2ML IJ SOLN
4.0000 mg | Freq: Once | INTRAMUSCULAR | Status: AC
  Administered 2021-01-26: 4 mg via INTRAVENOUS
  Filled 2021-01-26: qty 2

## 2021-01-26 MED ORDER — SODIUM CHLORIDE 0.9 % IV BOLUS
1000.0000 mL | Freq: Once | INTRAVENOUS | Status: AC
  Administered 2021-01-26: 1000 mL via INTRAVENOUS

## 2021-01-26 MED ORDER — ONDANSETRON HCL 4 MG PO TABS: 4.0000 mg | ORAL_TABLET | Freq: Four times a day (QID) | ORAL | 0 refills | Status: DC

## 2021-01-26 NOTE — ED Notes (Signed)
Pts O2 sats noted to fluctuate between 89-93% on RA with good waveform noted. PT denies ShOB, no fevers recently.  EDP [place] made aware.

## 2021-01-26 NOTE — ED Triage Notes (Signed)
Pt reports NVD since about 1400 today

## 2021-01-26 NOTE — ED Notes (Signed)
Pt provided ginger ale for PO challenge  

## 2021-01-26 NOTE — ED Notes (Signed)
Pt provided discharge instructions and prescription information. Pt was given the opportunity to ask questions and questions were answered. Discharge signature not obtained in the setting of the COVID-19 pandemic in order to reduce high touch surfaces.  ° °

## 2021-01-26 NOTE — Discharge Instructions (Signed)
Overall suspect foodborne illness.  Continue to take Zofran as needed.  Continue to hydrate.  Please return if symptoms worsen.

## 2021-01-26 NOTE — ED Provider Notes (Signed)
[facility] DEPART[facility] Provider Note   CSN: [id number] Arrival date & time: 01/26/21  1645     History  Chief Complaint  Patient presents with   Emesis   Diarrhea    [name] is a 81 y.o. female.  The history is provided by the patient.  Emesis Severity:  Mild Timing:  Intermittent Progression:  Improving Chronicity:  New Relieved by:  Nothing Worsened by:  Nothing Ineffective treatments:  None tried Associated symptoms: diarrhea   Associated symptoms: no abdominal pain, no arthralgias, no chills, no cough, no fever, no headaches, no myalgias, no sore throat and no URI   Risk factors: suspect food intake   Diarrhea Quality:  Watery Timing:  Intermittent Progression:  Unchanged Relieved by:  Nothing Worsened by:  Nothing Associated symptoms: vomiting   Associated symptoms: no abdominal pain, no arthralgias, no chills, no fever, no headaches, no myalgias and no URI   Risk factors: no sick contacts       Home Medications Prior to Admission medications   Medication Sig Start Date End Date Taking? Authorizing Provider  ondansetron (ZOFRAN) 4 MG tablet Take 1 tablet (4 mg total) by mouth every 6 (six) hours. 01/26/21  Yes Hades Mathew, DO  aspirin EC 81 MG tablet Take 81 mg by mouth daily.    [provider]  canagliflozin (INVOKANA) 300 MG TABS tablet Take by mouth.    [provider]  carvedilol (COREG) 6.25 MG tablet Take 1 tablet (6.25 mg total) by mouth 2 (two) times daily. 10/20/20   Martinique, Peter M, MD  clopidogrel (PLAVIX) 75 MG tablet Take 1 tablet (75 mg total) by mouth daily. 10/20/20   Martinique, Peter M, MD  estradiol (VIVELLE-DOT) 0.05 MG/24HR patch Place 1 patch (0.05 mg total) onto the skin 2 (two) times a week. 11/26/18   Fontaine, Belinda Block, MD  Ibuprofen-diphenhydrAMINE Cit (ADVIL PM PO) Take by mouth at bedtime.    [provider]  losartan (COZAAR) 25 MG tablet Take 1 tablet (25 mg total) by mouth  daily. 10/20/20   Martinique, Peter M, MD  MAGNESIUM PO Take by mouth.    [provider]  METFORMIN HCL PO Take 500 mg by mouth 2 (two) times daily.      [provider]  omeprazole (PRILOSEC) 20 MG capsule Take 20 mg by mouth daily.      [provider]  [facility] VERIO test strip 1 strip by Other route daily. Use 1 strip to check glucose once a day 07/21/14   [provider]  rosuvastatin (CRESTOR) 10 MG tablet Take 10 mg by mouth daily.      [provider]  TOUJEO SOLOSTAR 300 UNIT/ML SOPN Inject 80 Units as directed daily.  01/24/14   [provider]      Allergies    Erythromycin    Review of Systems   Review of Systems  Constitutional:  Negative for chills and fever.  HENT:  Negative for ear pain and sore throat.   Eyes:  Negative for pain and visual disturbance.  Respiratory:  Negative for cough and shortness of breath.   Cardiovascular:  Negative for chest pain and palpitations.  Gastrointestinal:  Positive for diarrhea, nausea and vomiting. Negative for abdominal pain.  Genitourinary:  Negative for dysuria and hematuria.  Musculoskeletal:  Negative for arthralgias, back pain and myalgias.  Skin:  Negative for color change and rash.  Neurological:  Negative for seizures, syncope and headaches.  All  other systems reviewed and are negative.  Physical Exam Updated Vital Signs BP (!) 143/59    Pulse 81    Temp 98.3 F (36.8 C) (Oral)    Resp 18    Ht 5\' 6"  (1.676 m)    Wt 86.2 kg    SpO2 91%    BMI 30.67 kg/m  Physical Exam Vitals and nursing note reviewed.  Constitutional:      General: She is not in acute distress.    Appearance: She is well-developed. She is not ill-appearing.  HENT:     Head: Normocephalic and atraumatic.     Nose: Nose normal.     Mouth/Throat:     Mouth: Mucous membranes are moist.  Eyes:     Extraocular Movements: Extraocular movements intact.     Conjunctiva/sclera: Conjunctivae normal.     Pupils:  Pupils are equal, round, and reactive to light.  Cardiovascular:     Rate and Rhythm: Normal rate and regular rhythm.     Pulses: Normal pulses.     Heart sounds: Normal heart sounds. No murmur heard. Pulmonary:     Effort: Pulmonary effort is normal. No respiratory distress.     Breath sounds: Normal breath sounds.  Abdominal:     Palpations: Abdomen is soft.     Tenderness: There is no abdominal tenderness.  Musculoskeletal:        General: No swelling.     Cervical back: Neck supple.  Skin:    General: Skin is warm and dry.     Capillary Refill: Capillary refill takes less than 2 seconds.  Neurological:     General: No focal deficit present.     Mental Status: She is alert.  Psychiatric:        Mood and Affect: Mood normal.    ED Results / Procedures / Treatments   Labs (all labs ordered are listed, but only abnormal results are displayed) Labs Reviewed  COMPREHENSIVE METABOLIC PANEL - Abnormal; Notable for the following components:      Result Value   Glucose, Bld 178 (*)    Total Protein 8.2 (*)    Total Bilirubin 2.1 (*)    All other components within normal limits  CBC - Abnormal; Notable for the following components:   WBC 10.9 (*)    RBC 5.46 (*)    Hemoglobin 15.4 (*)    HCT 47.4 (*)    All other components within normal limits  URINALYSIS, ROUTINE W REFLEX MICROSCOPIC - Abnormal; Notable for the following components:   Specific Gravity, Urine >1.030 (*)    Glucose, UA >=500 (*)    Hgb urine dipstick SMALL (*)    Ketones, ur 15 (*)    All other components within normal limits  URINALYSIS, MICROSCOPIC (REFLEX) - Abnormal; Notable for the following components:   Bacteria, UA MANY (*)    All other components within normal limits  RESP PANEL BY RT-PCR (FLU A&B, COVID) ARPGX2  LIPASE, BLOOD    EKG None  Radiology DG Chest Portable 1 View  Result Date: 01/26/2021 CLINICAL DATA:  Nausea, vomiting and diarrhea. EXAM: PORTABLE CHEST 1 VIEW COMPARISON:   October 08, 2012 FINDINGS: A trace amount of atelectasis is seen within the bilateral lung bases. There is no evidence of acute infiltrate, pleural effusion or pneumothorax. The heart size and mediastinal contours are within normal limits. There is marked severity calcification of the aortic arch. Mild dextroscoliosis of the midthoracic spine is noted. IMPRESSION: No acute cardiopulmonary disease.  Electronically Signed   By: Virgina Norfolk M.D.   On: 01/26/2021 20:40    Procedures Procedures    Medications Ordered in ED Medications  sodium chloride 0.9 % bolus 1,000 mL (0 mLs Intravenous Stopped 01/26/21 2113)  ondansetron (ZOFRAN) injection 4 mg (4 mg Intravenous Given 01/26/21 2010)    ED Course/ Medical Decision Making/ A&P                           Medical Decision Making  [facility] GLASBY is here with nausea, vomiting, diarrhea.  Overall normal vitals.  No fever.  No significant medical history.  No abdominal pain.  No chest pain or shortness of breath.  Ate breakfast and then shortly afterwards developed several episodes of nausea, vomiting, diarrhea.  COVID and flu test are negative.  Lab work was obtained and I interpreted them.  White count overall unremarkable.  No significant anemia.  No significant electrolyte abnormality or kidney injury.  Urinalysis negative for infection.  Chest x-ray negative for infection.  Patient was given IV fluids and IV Zofran with improvement.  Overall no concern for acute intra-abdominal infection such as appendicitis or colitis.  She appears very well.  Suspect that this is a viral foodborne process.  Will prescribe Zofran and understand return precautions.  Discharged in good condition.  This chart was dictated using voice recognition software.  Despite best efforts to proofread,  errors can occur which can change the documentation meaning.          Final Clinical Impression(s) / ED Diagnoses Final diagnoses:  Nausea vomiting and diarrhea     Rx / DC Orders ED Discharge Orders          Ordered    ondansetron (ZOFRAN) 4 MG tablet  Every 6 hours        01/26/21 2138              Lennice Sites, DO 01/26/21 2140

## 2021-03-29 ENCOUNTER — Telehealth: Payer: Self-pay | Admitting: Cardiology

## 2021-03-29 NOTE — Telephone Encounter (Signed)
Pt c/o BP issue: STAT if pt c/o blurred vision, one-sided weakness or slurred speech ? ?1. What are your last 5 BP readings?  ?03/28/21 134/91 HR 157 125/93 HR 155 around 9:00 PM  ?03/29/21 115/61 HR 67 then HR was 73 around 1:00 PM ? ?2. Are you having any other symptoms (ex. Dizziness, headache, blurred vision, passed out)? Headache  ? ?3. What is your BP issue? Patient is calling stating she had an episode where it felt her heart was racing last night. She had a very bad headache and had to a take a couple aspirin to get it to come down. BP and HR is now normal, but she still has a headache. Patient was advised by PCP to call our office in regards to this. Please advise.   ?

## 2021-03-29 NOTE — Telephone Encounter (Signed)
Spoke to patient she stated she had a episode of fast heart beat yesterday lasting appox 1 to 2 hours.B/P 134/91,125/93 pulse 157,155.No chest pain.No sob.She felt dizzy.Today she feels fine.B/P 120/62 pulse 73.Dr.Jordan out of office this week.No PA appointments available.Appointment scheduled with Laurann Montana PA 3/15 at 8:50 am at [facility] location.Advised to go to ED if she starts having fast heart beat again. ?

## 2021-04-04 NOTE — Progress Notes (Unsigned)
Office Visit    Patient Name: [name] Date of Encounter: 04/04/2021  Primary Care Provider:  Haywood Pao, MD Primary Cardiologist:  None  Chief Complaint    1 year annual follow-up  History of Present Illness    [name] is a 81 y.o. female with PMH of CAD, HTN, HLD, IDDM, GERD.2008 s/p PCI with Taxus stent to LAD.  Nuclear stress test 2014 abnormal and cardiac cath repeated 10/09/12 with nonobstructive CAD widely patent stent, normal LV function.  Carotid Dopplers 11/18 with mild carotid plaque and no stenosis.    Sincelast being seen in our clinic [name] reports doing ***.  She@ denies chest pain, palpitations, dyspnea, PND, orthopnea, nausea, vomiting, dizziness, syncope, edema, weight gain, or early satiety.     Past Medical History    Past Medical History:  Diagnosis Date   CAD (coronary artery disease)    Cataract    Chronic cough    Diabetes mellitus    Endometriosis    GERD (gastroesophageal reflux disease)    History of heart artery stent    Hyperlipidemia    Hypertension    OA (osteoarthritis)    Obesity    Past Surgical History:  Procedure Laterality Date   ANGIOPLASTY     APPENDECTOMY     BREAST ENHANCEMENT SURGERY  age 56   CARDIAC CATHETERIZATION  07/17/2006   CARDIOVASCULAR STRESS TEST  03/31/2009   EF 72%   CATARACT EXTRACTION  2018   CHOLECYSTECTOMY     heart stent     LEFT HEART CATHETERIZATION WITH CORONARY ANGIOGRAM N/A 10/09/2012   Procedure: LEFT HEART CATHETERIZATION WITH CORONARY ANGIOGRAM;  Surgeon: Peter M Martinique, MD;  Location: [facility] CATH LAB;  Service: Cardiovascular;  Laterality: N/A;   TONSILLECTOMY     VAGINAL HYSTERECTOMY  1973    Allergies  Allergies  Allergen Reactions   Erythromycin Other (See Comments)    Mouth ulcers    Home Medications    Current Outpatient Medications  Medication Sig Dispense Refill   aspirin EC 81 MG tablet Take 81 mg by mouth daily.     canagliflozin (INVOKANA)  300 MG TABS tablet Take by mouth.     carvedilol (COREG) 6.25 MG tablet Take 1 tablet (6.25 mg total) by mouth 2 (two) times daily. 180 tablet 3   clopidogrel (PLAVIX) 75 MG tablet Take 1 tablet (75 mg total) by mouth daily. 90 tablet 3   estradiol (VIVELLE-DOT) 0.05 MG/24HR patch Place 1 patch (0.05 mg total) onto the skin 2 (two) times a week. 24 patch 4   Ibuprofen-diphenhydrAMINE Cit (ADVIL PM PO) Take by mouth at bedtime.     losartan (COZAAR) 25 MG tablet Take 1 tablet (25 mg total) by mouth daily. 90 tablet 3   MAGNESIUM PO Take by mouth.     METFORMIN HCL PO Take 500 mg by mouth 2 (two) times daily.       omeprazole (PRILOSEC) 20 MG capsule Take 20 mg by mouth daily.       ondansetron (ZOFRAN) 4 MG tablet Take 1 tablet (4 mg total) by mouth every 6 (six) hours. 12 tablet 0   ONETOUCH VERIO test strip 1 strip by Other route daily. Use 1 strip to check glucose once a day  0   rosuvastatin (CRESTOR) 10 MG tablet Take 10 mg by mouth daily.       TOUJEO SOLOSTAR 300 UNIT/ML SOPN Inject 80 Units as directed daily.   0  Current Facility-Administered Medications  Medication Dose Route Frequency Provider Last Rate Last Admin   0.9 %  sodium chloride infusion  500 mL Intravenous Continuous Irene Shipper, MD         Review of Systems  Please see the history of present illness.    (+)*** (+)***  All other systems reviewed and are otherwise negative except as noted above.    Physical Exam    Wt Readings from Last 3 Encounters:  01/26/21 190 lb (86.2 kg)  09/01/20 195 lb (88.5 kg)  08/20/19 197 lb (89.4 kg)    EH:UDJSH were no vitals filed for this visit.   GEN: Well nourished, well developed, in no acute distress. Neck: Supple, no JVD, carotid bruits, or masses. Cardiac: S1,S2,RRR, no murmurs, rubs, or gallops. No clubbing, cyanosis, edema.   Radials/PT 2+ and equal bilaterally.  Respiratory:  Respirations regular and unlabored, clear to auscultation bilaterally. MS: no  deformity or atrophy. Skin: warm and dry, no rash. Neuro:  Strength and sensation are intact. Psych: Normal affect.  Accessory Clinical Findings    ECG personally reviewed by me today - ***  with rate of ***- no acute changes.  Risk Assessment/Calculations:   {Does this patient have ATRIAL FIBRILLATION?:[id number]}        Lab Results  Component Value Date   WBC 10.9 (H) 01/26/2021   HGB 15.4 (H) 01/26/2021   HCT 47.4 (H) 01/26/2021   MCV 86.8 01/26/2021   PLT 236 01/26/2021   Lab Results  Component Value Date   CREATININE 0.53 01/26/2021   BUN 18 01/26/2021   NA 139 01/26/2021   K 4.0 01/26/2021   CL 104 01/26/2021   CO2 23 01/26/2021   Lab Results  Component Value Date   ALT 29 01/26/2021   AST 32 01/26/2021   ALKPHOS 70 01/26/2021   BILITOT 2.1 (H) 01/26/2021   No results found for: CHOL, HDL, LDLCALC, LDLDIRECT, TRIG, CHOLHDL  No results found for: HGBA1C  Assessment & Plan    1.  Coronary artery disease: -s/p stent to LAD 2008, repeat cath 2014 with nonobstructive CAD. -She reports no anginal symptoms, SOB, dizziness at this time. -Continue GDMT with carvedilol 6.25 mg, Plavix 75 mg, ASA 81 mg  2.  Hypertension: -Blood pressure today was*** -Continue losartan 25 mg, carvedilol 6.25 mg  3.  Hyperlipidemia: -Last LDL was 55 2021 triglycerides 250 2021 -Labs by PCP -Continue Crestor 10 mg  4.  IDDM: -Last hemoglobin A1c: 7.2 percent -Followed by PCP  Disposition: Follow-up with Dr./APP  in *** month/year  {Are you ordering a CV Procedure (e.g. stress test, cath, DCCV, TEE, etc)?   Press F2        :[id number]}   Medication Adjustments/Labs and Tests Ordered: Current medicines are reviewed at length with the patient today.  Concerns regarding medicines are outlined above.  Tests Ordered: No orders of the defined types were placed in this encounter.  Medication Changes: No orders of the defined types were placed in this encounter.    Mable Fill,  Marissa Nestle, NP 04/04/2021, 4:24 PM

## 2021-04-07 ENCOUNTER — Encounter (HOSPITAL_BASED_OUTPATIENT_CLINIC_OR_DEPARTMENT_OTHER): Payer: Self-pay | Admitting: Nurse Practitioner

## 2021-04-07 ENCOUNTER — Ambulatory Visit (HOSPITAL_BASED_OUTPATIENT_CLINIC_OR_DEPARTMENT_OTHER): Payer: HMO | Admitting: Nurse Practitioner

## 2021-04-07 ENCOUNTER — Ambulatory Visit (INDEPENDENT_AMBULATORY_CARE_PROVIDER_SITE_OTHER): Payer: HMO

## 2021-04-07 ENCOUNTER — Other Ambulatory Visit: Payer: Self-pay

## 2021-04-07 VITALS — BP 128/84 | HR 66 | Ht 66.0 in | Wt 192.5 lb

## 2021-04-07 DIAGNOSIS — I499 Cardiac arrhythmia, unspecified: Secondary | ICD-10-CM

## 2021-04-07 DIAGNOSIS — I10 Essential (primary) hypertension: Secondary | ICD-10-CM | POA: Diagnosis not present

## 2021-04-07 DIAGNOSIS — E782 Mixed hyperlipidemia: Secondary | ICD-10-CM

## 2021-04-07 DIAGNOSIS — I25118 Atherosclerotic heart disease of native coronary artery with other forms of angina pectoris: Secondary | ICD-10-CM

## 2021-04-07 NOTE — Patient Instructions (Signed)
Medication Instructions:  ?May take an extra Carvedilol 6.25 mg for palpations symptoms lasting greater than 30 minutes.  ? ?*If you need a refill on your cardiac medications before your next appointment, please call your pharmacy* ? ? ?Lab Work: ?Your provider has recommended lab work today (CBC, BMP, TSH) Please have this collected at [facility] [place]. The lab is open 8:00 am - 4:30 pm. Please avoid 12:00p - 1:00p for lunch hour. You do not need an appointment. Please go to [address] [facility] [place], [place] [address]. This is in the Primary Care office on the 3rd floor, let them know you are there for blood work and they will direct you to the lab. ? ?If you have labs (blood work) drawn today and your tests are completely normal, you will receive your results only by: ?MyChart Message (if you have MyChart) OR ?A paper copy in the mail ?If you have any lab test that is abnormal or we need to change your treatment, we will call you to review the results. ? ? ?Testing/Procedures: ?Your physician has requested that you have an echocardiogram in 2 weeks. Echocardiography is a painless test that uses sound waves to create images of your heart. It provides your doctor with information about the size and shape of your heart and how well your heart?s chambers and valves are working. This procedure takes approximately one hour. There are no restrictions for this procedure. ?[place] ? ?Your physician has recommended that you wear a Zio monitor.  ? ?This monitor is a medical device that records the heart?s electrical activity. Doctors most often use these monitors to diagnose arrhythmias. Arrhythmias are problems with the speed or rhythm of the heartbeat. The monitor is a small device applied to your chest. You can wear one while you do your normal daily activities. While wearing this monitor if you have any symptoms to push the button and record what you felt. Once you  have worn this monitor for the period of time provider prescribed (Usually 14 days), you will return the monitor device in the postage paid box. Once it is returned they will download the data collected and provide [place] with a report which the provider will then review and we will call you with those results. Important tips: ? ?Avoid showering during the first 24 hours of wearing the monitor. ?Avoid excessive sweating to help maximize wear time. ?Do not submerge the device, no hot tubs, and no swimming pools. ?Keep any lotions or oils away from the patch. ?After 24 hours you may shower with the patch on. Take brief showers with your back facing the shower head.  ?Do not remove patch once it has been placed because that will interrupt data and decrease adhesive wear time. ?Push the button when you have any symptoms and write down what you were feeling. ?Once you have completed wearing your monitor, remove and place into box which has postage paid and place in your outgoing mailbox.  ?If for some reason you have misplaced your box then call our office and we can provide another box and/or mail it off for you. ? ?  ? ?Follow-Up: ?At [facility], you and your health needs are our priority.  As part of our continuing mission to provide you with exceptional heart care, we have created designated Provider Care Teams.  These Care Teams include your primary Cardiologist (physician) and Advanced Practice Providers (APPs -  Physician Assistants and Nurse Practitioners)  who all work together to provide you with the care you need, when you need it. ? ?We recommend signing up for the patient portal called "MyChart".  Sign up information is provided on this After Visit Summary.  MyChart is used to connect with patients for Virtual Visits (Telemedicine).  Patients are able to view lab/test results, encounter notes, upcoming appointments, etc.  Non-urgent messages can be sent to your provider as well.   ?To learn more about what  you can do with MyChart, go to [email or web address].   ? ?Your next appointment:   ?6 week(s) ? ?The format for your next appointment:   ?In Person ? ?Provider:   ?Laurann Montana, NP{ ? ? ?To prevent palpitations: ?Make sure you are adequately hydrated.  ?Avoid and/or limit caffeine containing beverages like soda or tea. ?Exercise regularly.  ?Manage stress well. ?Some over the counter medications can cause palpitations such as Benadryl, AdvilPM, TylenolPM. Regular Advil or Tylenol do not cause palpitations.   ?

## 2021-04-08 ENCOUNTER — Ambulatory Visit: Payer: HMO | Admitting: General Practice

## 2021-04-08 LAB — BASIC METABOLIC PANEL
BUN/Creatinine Ratio: 19 (ref 12–28)
BUN: 12 mg/dL (ref 8–27)
CO2: 24 mmol/L (ref 20–29)
Calcium: 9.4 mg/dL (ref 8.7–10.3)
Chloride: 107 mmol/L — ABNORMAL HIGH (ref 96–106)
Creatinine, Ser: 0.63 mg/dL (ref 0.57–1.00)
Glucose: 138 mg/dL — ABNORMAL HIGH (ref 70–99)
Potassium: 4.2 mmol/L (ref 3.5–5.2)
Sodium: 144 mmol/L (ref 134–144)
eGFR: 90 mL/min/{1.73_m2} (ref >59)

## 2021-04-08 LAB — CBC
Hematocrit: 43.1 % (ref 34.0–46.6)
Hemoglobin: 14.3 g/dL (ref 11.1–15.9)
MCH: 27.7 pg (ref 26.6–33.0)
MCHC: 33.2 g/dL (ref 31.5–35.7)
MCV: 83 fL (ref 79–97)
Platelets: 239 10*3/uL (ref 150–450)
RBC: 5.17 x10E6/uL (ref 3.77–5.28)
RDW: 14.5 % (ref 11.7–15.4)
WBC: 6.1 10*3/uL (ref 3.4–10.8)

## 2021-04-08 LAB — TSH: TSH: 3 u[IU]/mL (ref 0.450–4.500)

## 2021-04-13 NOTE — Addendum Note (Signed)
Addended by: Meryl Crutch on: 04/13/2021 08:07 AM ? ? Modules accepted: Orders ? ?

## 2021-04-15 DIAGNOSIS — L820 Inflamed seborrheic keratosis: Secondary | ICD-10-CM | POA: Diagnosis not present

## 2021-04-15 DIAGNOSIS — D1801 Hemangioma of skin and subcutaneous tissue: Secondary | ICD-10-CM | POA: Diagnosis not present

## 2021-04-15 DIAGNOSIS — L821 Other seborrheic keratosis: Secondary | ICD-10-CM | POA: Diagnosis not present

## 2021-04-15 DIAGNOSIS — B372 Candidiasis of skin and nail: Secondary | ICD-10-CM | POA: Diagnosis not present

## 2021-04-15 DIAGNOSIS — L720 Epidermal cyst: Secondary | ICD-10-CM | POA: Diagnosis not present

## 2021-04-21 ENCOUNTER — Encounter (HOSPITAL_BASED_OUTPATIENT_CLINIC_OR_DEPARTMENT_OTHER): Payer: Self-pay

## 2021-04-21 ENCOUNTER — Other Ambulatory Visit: Payer: Self-pay

## 2021-04-21 ENCOUNTER — Ambulatory Visit (INDEPENDENT_AMBULATORY_CARE_PROVIDER_SITE_OTHER): Payer: HMO

## 2021-04-21 DIAGNOSIS — I499 Cardiac arrhythmia, unspecified: Secondary | ICD-10-CM

## 2021-04-21 DIAGNOSIS — I10 Essential (primary) hypertension: Secondary | ICD-10-CM | POA: Diagnosis not present

## 2021-04-21 DIAGNOSIS — R0609 Other forms of dyspnea: Secondary | ICD-10-CM

## 2021-04-21 LAB — ECHOCARDIOGRAM COMPLETE
AR max vel: 2.36 cm2
AV Area VTI: 2.21 cm2
AV Area mean vel: 2.17 cm2
AV Mean grad: 2 mmHg
AV Peak grad: 3.8 mmHg
Ao pk vel: 0.98 m/s
Area-P 1/2: 2.52 cm2

## 2021-04-27 DIAGNOSIS — M47892 Other spondylosis, cervical region: Secondary | ICD-10-CM | POA: Diagnosis not present

## 2021-04-27 DIAGNOSIS — Z794 Long term (current) use of insulin: Secondary | ICD-10-CM | POA: Diagnosis not present

## 2021-04-27 DIAGNOSIS — I2510 Atherosclerotic heart disease of native coronary artery without angina pectoris: Secondary | ICD-10-CM | POA: Diagnosis not present

## 2021-04-27 DIAGNOSIS — E1159 Type 2 diabetes mellitus with other circulatory complications: Secondary | ICD-10-CM | POA: Diagnosis not present

## 2021-04-27 DIAGNOSIS — D692 Other nonthrombocytopenic purpura: Secondary | ICD-10-CM | POA: Diagnosis not present

## 2021-04-27 DIAGNOSIS — I499 Cardiac arrhythmia, unspecified: Secondary | ICD-10-CM | POA: Diagnosis not present

## 2021-04-27 DIAGNOSIS — E7800 Pure hypercholesterolemia, unspecified: Secondary | ICD-10-CM | POA: Diagnosis not present

## 2021-04-27 DIAGNOSIS — E1151 Type 2 diabetes mellitus with diabetic peripheral angiopathy without gangrene: Secondary | ICD-10-CM | POA: Diagnosis not present

## 2021-04-27 DIAGNOSIS — M1380 Other specified arthritis, unspecified site: Secondary | ICD-10-CM | POA: Diagnosis not present

## 2021-04-27 DIAGNOSIS — I119 Hypertensive heart disease without heart failure: Secondary | ICD-10-CM | POA: Diagnosis not present

## 2021-04-27 DIAGNOSIS — F331 Major depressive disorder, recurrent, moderate: Secondary | ICD-10-CM | POA: Diagnosis not present

## 2021-04-27 DIAGNOSIS — E1165 Type 2 diabetes mellitus with hyperglycemia: Secondary | ICD-10-CM | POA: Diagnosis not present

## 2021-04-27 DIAGNOSIS — E669 Obesity, unspecified: Secondary | ICD-10-CM | POA: Diagnosis not present

## 2021-05-18 DIAGNOSIS — L820 Inflamed seborrheic keratosis: Secondary | ICD-10-CM | POA: Diagnosis not present

## 2021-05-20 ENCOUNTER — Ambulatory Visit (HOSPITAL_BASED_OUTPATIENT_CLINIC_OR_DEPARTMENT_OTHER): Payer: HMO | Admitting: Family

## 2021-05-20 ENCOUNTER — Encounter (HOSPITAL_BASED_OUTPATIENT_CLINIC_OR_DEPARTMENT_OTHER): Payer: Self-pay | Admitting: Family

## 2021-05-20 VITALS — BP 126/68 | HR 71 | Ht 66.0 in | Wt 193.1 lb

## 2021-05-20 DIAGNOSIS — E1165 Type 2 diabetes mellitus with hyperglycemia: Secondary | ICD-10-CM

## 2021-05-20 DIAGNOSIS — I25118 Atherosclerotic heart disease of native coronary artery with other forms of angina pectoris: Secondary | ICD-10-CM

## 2021-05-20 DIAGNOSIS — E782 Mixed hyperlipidemia: Secondary | ICD-10-CM | POA: Diagnosis not present

## 2021-05-20 DIAGNOSIS — I10 Essential (primary) hypertension: Secondary | ICD-10-CM | POA: Diagnosis not present

## 2021-05-20 NOTE — Progress Notes (Signed)
? ?Office Visit  ?  ?Patient Name: [name] ?Date of Encounter: 05/20/2021 ? ?PCP:  Haywood Pao, MD ?  ?[place]  ?Cardiologist:  Peter Martinique, MD  ?Advanced Practice Provider:  No care team member to display ?Electrophysiologist:  None  ?   ? ?Chief Complaint  ?  ?[name] is a 81 y.o. female with a hx of CAD, hypertension, hyperlipidemia, DM 2, GERD presents today for follow up after ZIO monitor.   ? ?Past Medical History  ?  ?Past Medical History:  ?Diagnosis Date  ? CAD (coronary artery disease)   ? Cataract   ? Chronic cough   ? Diabetes mellitus   ? Endometriosis   ? GERD (gastroesophageal reflux disease)   ? History of heart artery stent   ? Hyperlipidemia   ? Hypertension   ? OA (osteoarthritis)   ? Obesity   ? ?Past Surgical History:  ?Procedure Laterality Date  ? ANGIOPLASTY    ? APPENDECTOMY    ? BREAST ENHANCEMENT SURGERY  age 79  ? CARDIAC CATHETERIZATION  07/17/2006  ? CARDIOVASCULAR STRESS TEST  03/31/2009  ? EF 72%  ? CATARACT EXTRACTION  2018  ? CHOLECYSTECTOMY    ? heart stent    ? LEFT HEART CATHETERIZATION WITH CORONARY ANGIOGRAM N/A 10/09/2012  ? Procedure: LEFT HEART CATHETERIZATION WITH CORONARY ANGIOGRAM;  Surgeon: Peter M Martinique, MD;  Location: [facility] C[facility] LAB;  Service: Cardiovascular;  Laterality: N/A;  ? TONSILLECTOMY    ? VAGINAL HYSTERECTOMY  1973  ? ? ?Allergies ? ?Allergies  ?Allergen Reactions  ? Erythromycin Other (See Comments)  ?  Mouth ulcers  ? ? ?History of Present Illness  ?  ?[name] is a 81 y.o. female with a hx of CAD, hypertension, hyperlipidemia, diabetes, GERD last seen 04/07/21. ? ?Cardiac cath 2008 with PCI with Taxus stent to LAD.  Nuclear stress test 2014 was abnormal leading to subsequent cardiac catheterization 10/09/2012 with nonobstructive coronary disease and patent stent, normal LVEF.  Carotid Doppler November 2018 mild carotid plaque but no stenosis. ? ?She is followed routinely with Dr. Martinique.  She was  last seen 04/07/2021 in the office by Ambrose Pancoast, NP due to palpitations.  Lab work including BMP, CBC, TSH overall unremarkable with the exception of mild dehydration.  14-day ZIO monitor placed with preliminary report reviewed revealing predominantly NSR 70 bpm, first-degree AV block, 2 runs of SVT with fastest and longest 17 beats at 132 bpm, rare PVC/PAC with burden less than 1%. ? ?She presents today for follow-up.  Very pleasant lady who enjoys gardening in her spare time.  She endorses feeling overall well since last seen.  She has had no recurrent palpitations.  We reviewed her ZIO monitor in detail and she is reassured by the result.  She has with me that her PCP transitioned her from [place] to [place] due to insurance coverage and we reviewed the cardiac benefits of Jardiance.  Reports no shortness of breath nor dyspnea on exertion. Reports no chest pain, pressure, or tightness. No edema, orthopnea, PND. ? ?EKGs/Labs/Other Studies Reviewed:  ? ?The following studies were reviewed today: ? ?zIO 03/2021 ?Patient had a min HR of 52 bpm, max HR of 132 bpm, and avg HR of 70 bpm. Predominant underlying rhythm was Sinus Rhythm. First Degree AV Block was present. 2 Supraventricular Tachycardia runs occurred, the run with the fastest interval lasting 17 beats  ?with a max rate of 132 bpm (avg  107 bpm); the run with the fastest interval was also the longest. Isolated SVEs were rare (<1.0%), SVE Couplets were rare (<1.0%), and SVE Triplets were rare (<1.0%). Isolated VEs were rare (<1.0%), VE Couplets were rare  ?(<1.0%), and no VE Triplets were present. Ventricular Bigeminy and Trigeminy were present. ? ?EKG:  No EKG today. ? ?Recent Labs: ?01/26/2021: ALT 29 ?04/07/2021: BUN 12; Creatinine, Ser 0.63; Hemoglobin 14.3; Platelets 239; Potassium 4.2; Sodium 144; TSH 3.000  ?Recent Lipid Panel ?No results found for: CHOL, TRIG, HDL, CHOLHDL, VLDL, LDLCALC, LDLDIRECT ? ? ?Home Medications  ? ?Current Meds  ?Medication  Sig  ? aspirin EC 81 MG tablet Take 81 mg by mouth daily.  ? canagliflozin (INVOKANA) 300 MG TABS tablet Take by mouth.  ? carvedilol (COREG) 6.25 MG tablet Take 1 tablet (6.25 mg total) by mouth 2 (two) times daily.  ? clopidogrel (PLAVIX) 75 MG tablet Take 1 tablet (75 mg total) by mouth daily.  ? estradiol (VIVELLE-DOT) 0.05 MG/24HR patch Place 1 patch (0.05 mg total) onto the skin 2 (two) times a week.  ? Ibuprofen-diphenhydrAMINE Cit (ADVIL PM PO) Take by mouth at bedtime.  ? losartan (COZAAR) 25 MG tablet Take 1 tablet (25 mg total) by mouth daily.  ? MAGNESIUM PO Take by mouth.  ? METFORMIN HCL PO Take 500 mg by mouth 2 (two) times daily.    ? omeprazole (PRILOSEC) 20 MG capsule Take 20 mg by mouth daily.    ? ondansetron (ZOFRAN) 4 MG tablet Take 1 tablet (4 mg total) by mouth every 6 (six) hours.  ? ONETOUCH VERIO test strip 1 strip by Other route daily. Use 1 strip to check glucose once a day  ? rosuvastatin (CRESTOR) 10 MG tablet Take 10 mg by mouth daily.    ? TOUJEO SOLOSTAR 300 UNIT/ML SOPN Inject 80 Units as directed daily.   ? ?Current Facility-Administered Medications for the 05/20/21 encounter (Office Visit) with Loel Dubonnet, NP  ?Medication  ? 0.9 %  sodium chloride infusion  ?  ? ?Review of Systems  ?    ?All other systems reviewed and are otherwise negative except as noted above. ? ?Physical Exam  ?  ?VS:  BP 126/68 (BP Location: Left Arm, Patient Position: Sitting, Cuff Size: Large)   Pulse 71   Ht '5\' 6"'$  (1.676 m)   Wt 193 lb 1.6 oz (87.6 kg)   SpO2 94%   BMI 31.17 kg/m?  , BMI Body mass index is 31.17 kg/m?. ? ?Wt Readings from Last 3 Encounters:  ?05/20/21 193 lb 1.6 oz (87.6 kg)  ?04/07/21 192 lb 8 oz (87.3 kg)  ?01/26/21 190 lb (86.2 kg)  ?  ? ?GEN: Well nourished, well developed, in no acute distress. ?HEENT: normal. ?Neck: Supple, no JVD, carotid bruits, or masses. ?Cardiac: RRR, no murmurs, rubs, or gallops. No clubbing, cyanosis, edema.  Radials/PT 2+ and equal bilaterally.   ?Respiratory:  Respirations regular and unlabored, clear to auscultation bilaterally. ?GI: Soft, nontender, nondistended. ?MS: No deformity or atrophy. ?Skin: Warm and dry, no rash. ?Neuro:  Strength and sensation are intact. ?Psych: Normal affect. ? ?Assessment & Plan  ?  ?Palpitations -no recurrence.  14-day Zio patch preliminary result with predominantly normal sinus rhythm.  2 short runs of SVT which were asymptomatic.  Continue current dose Coreg.  Encouraged to stay well-hydrated, avoid caffeine, manage stress well.  ? ?CAD - Stable with no anginal symptoms. No indication for ischemic evaluation.  GDMT includes Coreg, Plavix, Aspirin,  Rosuvasatin.  ? ?HTN - BP well controlled. Continue current antihypertensive regimen.   ? ?HLD - Continue Rosuvatatin '10mg'$  QD.  ? ?Disposition: Follow up in 1 year(s) with Peter Martinique, MD or APP. ? ?Signed, ?Loel Dubonnet, NP ?05/20/2021, 11:34 AM ?[place] ?

## 2021-05-20 NOTE — Patient Instructions (Signed)
Medication Instructions:  ?Continue your current medications.  ? ?*If you need a refill on your cardiac medications before your next appointment, please call your pharmacy* ? ? ?Lab Work: ?None ordered today.  ? ? ?Testing/Procedures: ? ?Your monitor showed normal sinus rhythm. You had two very short episodes of a fast heart beat called SVT. Your Carvedilol (Coreg) will help prevent this from happening as often. This is very common and not of concern.  ? ? ?Follow-Up: ?At [facility], you and your health needs are our priority.  As part of our continuing mission to provide you with exceptional heart care, we have created designated Provider Care Teams.  These Care Teams include your primary Cardiologist (physician) and Advanced Practice Providers (APPs -  Physician Assistants and Nurse Practitioners) who all work together to provide you with the care you need, when you need it. ? ?We recommend signing up for the patient portal called "MyChart".  Sign up information is provided on this After Visit Summary.  MyChart is used to connect with patients for Virtual Visits (Telemedicine).  Patients are able to view lab/test results, encounter notes, upcoming appointments, etc.  Non-urgent messages can be sent to your provider as well.   ?To learn more about what you can do with MyChart, go to [email or web address].   ? ?Your next appointment:   ?1 year(s) ? ?The format for your next appointment:   ?In Person ? ?Provider:   ?Peter Martinique, MD   ? ? ?Other Instructions ?Heart Healthy Diet Recommendations: ?A low-salt diet is recommended. Meats should be grilled, baked, or boiled. Avoid fried foods. Focus on lean protein sources like fish or chicken with vegetables and fruits. The American Heart Association is a Microbiologist!  American Heart Association Diet and Lifeystyle Recommendations   ? ?Exercise recommendations: ?The American Heart Association recommends 150 minutes of moderate intensity exercise weekly. ?Try 30  minutes of moderate intensity exercise 4-5 times per week. ?This could include walking, jogging, or swimming. ? ? ? ?Important Information About Sugar ? ? ? ? ?  ?

## 2021-07-22 ENCOUNTER — Other Ambulatory Visit: Payer: Self-pay

## 2021-07-22 MED ORDER — CLOPIDOGREL BISULFATE 75 MG PO TABS: 75.0000 mg | ORAL_TABLET | Freq: Every day | ORAL | 3 refills | Status: AC

## 2021-10-05 ENCOUNTER — Telehealth: Payer: Self-pay | Admitting: Cardiology

## 2021-10-05 NOTE — Telephone Encounter (Signed)
Attempt to reach, left message to return call.

## 2021-10-05 NOTE — Telephone Encounter (Signed)
STAT if patient feels like he/she is going to faint   Are you dizzy now?  Husband not currently with the patient  Do you feel faint or have you passed out?  No   Do you have any other symptoms?  Unsure   Have you checked your HR and BP (record if available)?  No

## 2021-10-08 ENCOUNTER — Other Ambulatory Visit: Payer: Self-pay

## 2021-10-08 MED ORDER — LOSARTAN POTASSIUM 25 MG PO TABS: 25.0000 mg | ORAL_TABLET | Freq: Every day | ORAL | 3 refills | Status: DC

## 2021-10-14 NOTE — Telephone Encounter (Signed)
Spoke with pt and husband, she reports occasional dizziness. It can occurs when lying in bed and turning her head. It can also occur when standing from sitting. She does not reports any congestion or allergies, aware the dizziness when lying in bed could be vertigo and she can try claritin for that. I asked her to check her blood pressure sitting and standing and she gets a significant drop to let us know. Patient voiced understanding

## 2021-10-22 DIAGNOSIS — H524 Presbyopia: Secondary | ICD-10-CM | POA: Diagnosis not present

## 2021-10-22 DIAGNOSIS — H52203 Unspecified astigmatism, bilateral: Secondary | ICD-10-CM | POA: Diagnosis not present

## 2021-10-22 DIAGNOSIS — E119 Type 2 diabetes mellitus without complications: Secondary | ICD-10-CM | POA: Diagnosis not present

## 2021-10-22 DIAGNOSIS — H353131 Nonexudative age-related macular degeneration, bilateral, early dry stage: Secondary | ICD-10-CM | POA: Diagnosis not present

## 2021-10-22 DIAGNOSIS — H43813 Vitreous degeneration, bilateral: Secondary | ICD-10-CM | POA: Diagnosis not present

## 2021-10-22 DIAGNOSIS — H04123 Dry eye syndrome of bilateral lacrimal glands: Secondary | ICD-10-CM | POA: Diagnosis not present

## 2021-10-28 DIAGNOSIS — E7800 Pure hypercholesterolemia, unspecified: Secondary | ICD-10-CM | POA: Diagnosis not present

## 2021-10-28 DIAGNOSIS — R7989 Other specified abnormal findings of blood chemistry: Secondary | ICD-10-CM | POA: Diagnosis not present

## 2021-10-28 DIAGNOSIS — I2510 Atherosclerotic heart disease of native coronary artery without angina pectoris: Secondary | ICD-10-CM | POA: Diagnosis not present

## 2021-10-28 DIAGNOSIS — E1165 Type 2 diabetes mellitus with hyperglycemia: Secondary | ICD-10-CM | POA: Diagnosis not present

## 2022-02-10 DIAGNOSIS — F331 Major depressive disorder, recurrent, moderate: Secondary | ICD-10-CM | POA: Diagnosis not present

## 2022-02-10 DIAGNOSIS — Z794 Long term (current) use of insulin: Secondary | ICD-10-CM | POA: Diagnosis not present

## 2022-02-10 DIAGNOSIS — E1159 Type 2 diabetes mellitus with other circulatory complications: Secondary | ICD-10-CM | POA: Diagnosis not present

## 2022-02-10 DIAGNOSIS — E1151 Type 2 diabetes mellitus with diabetic peripheral angiopathy without gangrene: Secondary | ICD-10-CM | POA: Diagnosis not present

## 2022-02-10 DIAGNOSIS — E7800 Pure hypercholesterolemia, unspecified: Secondary | ICD-10-CM | POA: Diagnosis not present

## 2022-02-10 DIAGNOSIS — E669 Obesity, unspecified: Secondary | ICD-10-CM | POA: Diagnosis not present

## 2022-02-10 DIAGNOSIS — I119 Hypertensive heart disease without heart failure: Secondary | ICD-10-CM | POA: Diagnosis not present

## 2022-02-10 DIAGNOSIS — I2510 Atherosclerotic heart disease of native coronary artery without angina pectoris: Secondary | ICD-10-CM | POA: Diagnosis not present

## 2022-02-10 DIAGNOSIS — D692 Other nonthrombocytopenic purpura: Secondary | ICD-10-CM | POA: Diagnosis not present

## 2022-02-10 DIAGNOSIS — R232 Flushing: Secondary | ICD-10-CM | POA: Diagnosis not present

## 2022-02-10 DIAGNOSIS — E1165 Type 2 diabetes mellitus with hyperglycemia: Secondary | ICD-10-CM | POA: Diagnosis not present

## 2022-02-10 DIAGNOSIS — Z79890 Hormone replacement therapy (postmenopausal): Secondary | ICD-10-CM | POA: Diagnosis not present

## 2022-05-19 DIAGNOSIS — R922 Inconclusive mammogram: Secondary | ICD-10-CM | POA: Diagnosis not present

## 2022-05-19 DIAGNOSIS — N6452 Nipple discharge: Secondary | ICD-10-CM | POA: Diagnosis not present

## 2022-05-19 DIAGNOSIS — R928 Other abnormal and inconclusive findings on diagnostic imaging of breast: Secondary | ICD-10-CM | POA: Diagnosis not present

## 2022-05-24 ENCOUNTER — Other Ambulatory Visit: Payer: Self-pay | Admitting: Radiology

## 2022-05-24 DIAGNOSIS — D242 Benign neoplasm of left breast: Secondary | ICD-10-CM | POA: Diagnosis not present

## 2022-05-24 DIAGNOSIS — R922 Inconclusive mammogram: Secondary | ICD-10-CM | POA: Diagnosis not present

## 2022-05-27 DIAGNOSIS — Z794 Long term (current) use of insulin: Secondary | ICD-10-CM | POA: Diagnosis not present

## 2022-05-27 DIAGNOSIS — Z79890 Hormone replacement therapy (postmenopausal): Secondary | ICD-10-CM | POA: Diagnosis not present

## 2022-05-27 DIAGNOSIS — M5412 Radiculopathy, cervical region: Secondary | ICD-10-CM | POA: Diagnosis not present

## 2022-05-27 DIAGNOSIS — E1151 Type 2 diabetes mellitus with diabetic peripheral angiopathy without gangrene: Secondary | ICD-10-CM | POA: Diagnosis not present

## 2022-05-27 DIAGNOSIS — F331 Major depressive disorder, recurrent, moderate: Secondary | ICD-10-CM | POA: Diagnosis not present

## 2022-05-27 DIAGNOSIS — E1159 Type 2 diabetes mellitus with other circulatory complications: Secondary | ICD-10-CM | POA: Diagnosis not present

## 2022-05-27 DIAGNOSIS — R232 Flushing: Secondary | ICD-10-CM | POA: Diagnosis not present

## 2022-05-27 DIAGNOSIS — M47892 Other spondylosis, cervical region: Secondary | ICD-10-CM | POA: Diagnosis not present

## 2022-05-27 DIAGNOSIS — I10 Essential (primary) hypertension: Secondary | ICD-10-CM | POA: Diagnosis not present

## 2022-05-27 DIAGNOSIS — D692 Other nonthrombocytopenic purpura: Secondary | ICD-10-CM | POA: Diagnosis not present

## 2022-05-27 DIAGNOSIS — E1165 Type 2 diabetes mellitus with hyperglycemia: Secondary | ICD-10-CM | POA: Diagnosis not present

## 2022-05-27 DIAGNOSIS — E669 Obesity, unspecified: Secondary | ICD-10-CM | POA: Diagnosis not present

## 2022-06-06 DIAGNOSIS — D1801 Hemangioma of skin and subcutaneous tissue: Secondary | ICD-10-CM | POA: Diagnosis not present

## 2022-06-06 DIAGNOSIS — L918 Other hypertrophic disorders of the skin: Secondary | ICD-10-CM | POA: Diagnosis not present

## 2022-06-06 DIAGNOSIS — L821 Other seborrheic keratosis: Secondary | ICD-10-CM | POA: Diagnosis not present

## 2022-06-06 DIAGNOSIS — L814 Other melanin hyperpigmentation: Secondary | ICD-10-CM | POA: Diagnosis not present

## 2022-06-13 ENCOUNTER — Telehealth: Payer: Self-pay | Admitting: *Deleted

## 2022-06-13 ENCOUNTER — Ambulatory Visit: Payer: Self-pay | Admitting: Surgery

## 2022-06-13 DIAGNOSIS — N6452 Nipple discharge: Secondary | ICD-10-CM | POA: Diagnosis not present

## 2022-06-13 DIAGNOSIS — D242 Benign neoplasm of left breast: Secondary | ICD-10-CM

## 2022-06-13 NOTE — Telephone Encounter (Signed)
Patient called and scheduled to see Edd Fabian, NP on Thursday 06/16/22 at 10:30. Patient asked to arrive 15 minutes prior to scheduled appointment time for the check in process. Patient thanked me for calling and getting her scheduled.

## 2022-06-13 NOTE — Telephone Encounter (Signed)
   Name: [name]  DOB: 1940-02-16  MRN: [id number]  Primary Cardiologist: Peter [place], MD  Chart reviewed as part of pre-operative protocol coverage. Because of [name]'s past medical history and time since last visit, she will require a follow-up in-office visit in order to better assess preoperative cardiovascular risk.  Pre-op covering staff: - Please schedule appointment and call patient to inform them. If patient already had an upcoming appointment within acceptable timeframe, please add "pre-op clearance" to the appointment notes so provider is aware. - Please contact requesting surgeon's office via preferred method (i.e, phone, fax) to inform them of need for appointment prior to surgery.  Per office protocol, if patient is without any new symptoms or concerns at the time of their visit, she may hold Aspirin for 5-7 days prior to procedure and Plavix for 5 days prior to procedure. Please resume Aspirin and Plavix as soon as possible postprocedure, at the discretion of the surgeon.    Joylene Grapes, NP  06/13/2022, 12:34 PM

## 2022-06-13 NOTE — H&P (Signed)
Subjective    Chief Complaint: New Consultation (Left breast papilloma)       History of Present Illness: [name] is a 82 y.o. female who is seen today as an office consultation at the request of Dr. Wylene Simmer for evaluation of New Consultation (Left breast papilloma) .     This is an 82 year old female with a history of bilateral retropectoral saline implants who recently developed spontaneous left nipple discharge that occasionally appears bloody.  She underwent workup including mammogram and ultrasound.  She has a 1.0 x 0.3 cm circumscribed mass in the left breast at 12:00 2 cm from the nipple.  She underwent biopsy of this that revealed a intraductal papilloma.  No other suspicious findings.     Review of Systems: A complete review of systems was obtained from the patient.  I have reviewed this information and discussed as appropriate with the patient.  See HPI as well for other ROS.   Review of Systems  Constitutional: Negative.   HENT: Negative.    Eyes: Negative.   Respiratory: Negative.    Cardiovascular: Negative.   Gastrointestinal: Negative.   Genitourinary: Negative.   Musculoskeletal: Negative.   Skin: Negative.   Neurological: Negative.   Endo/Heme/Allergies: Negative.   Psychiatric/Behavioral: Negative.          Medical History: Past Medical History      Past Medical History:  Diagnosis Date   Diabetes mellitus without complication (CMS/HHS-HCC)     Hypertension          Problem List     Patient Active Problem List  Diagnosis   Nipple discharge, bloody   Coronary atherosclerosis   Esophageal reflux   Hyperlipidemia   Hypertension        Past Surgical History  History reviewed. No pertinent surgical history.      Allergies       Allergies  Allergen Reactions   Erythromycin Other (See Comments)      Mouth ulcers        Medications Ordered Prior to Encounter        Current Outpatient Medications on File Prior to Visit   Medication Sig Dispense Refill   carvediloL (COREG) 6.25 MG tablet Take 6.25 mg by mouth every 12 (twelve) hours       clopidogreL (PLAVIX) 75 mg tablet Take 75 mg by mouth once daily       empagliflozin (JARDIANCE) 25 mg tablet Take 25 mg by mouth once daily       estradioL (CLIMARA) 0.025 mg/24 hr patch Place 1 patch onto the skin once a week       losartan (COZAAR) 25 MG tablet Take 25 mg by mouth once daily       metFORMIN (GLUCOPHAGE) 500 MG tablet TAKE 1 TABLET BY MOUTH TWICE DAILY WITH FOOD 90       OZEMPIC 0.25 mg or 0.5 mg (2 mg/3 mL) pen injector TAKE 0.5MG  SUBCUTANEOUS ONCE WEEKLY 28 DAYS       rosuvastatin (CRESTOR) 10 MG tablet Take 10 mg by mouth once daily        No current facility-administered medications on file prior to visit.        Family History  History reviewed. No pertinent family history.      Tobacco Use History  Social History       Tobacco Use  Smoking Status Never  Smokeless Tobacco Never        Social History  Social History  Socioeconomic History   Marital status: Married  Tobacco Use   Smoking status: Never   Smokeless tobacco: Never  Substance and Sexual Activity   Alcohol use: Not Currently   Drug use: Never        Objective:         Vitals:    06/13/22 0907  BP: 117/70  Pulse: 79  Temp: 36.6 C (97.9 F)  SpO2: 98%  Weight: 77.1 kg (170 lb)  Height: 166.4 cm (5' 5.5")  PainSc: 0-No pain    Body mass index is 27.86 kg/m.   Physical Exam    Constitutional:  WDWN in NAD, conversant, no obvious deformities; lying in bed comfortably Eyes:  Pupils equal, round; sclera anicteric; moist conjunctiva; no lid lag HENT:  Oral mucosa moist; good dentition  Neck:  No masses palpated, trachea midline; no thyromegaly Lungs:  CTA bilaterally; normal respiratory effort Breasts:  symmetric, no nipple changes; bilateral implants; no palpable masses or lymphadenopathy on either side CV:  Regular rate and rhythm; no murmurs;  extremities well-perfused with no edema Abd:  +bowel sounds, soft, non-tender, no palpable organomegaly; no palpable hernias Musc:  Normal gait; no apparent clubbing or cyanosis in extremities Lymphatic:  No palpable cervical or axillary lymphadenopathy Skin:  Warm, dry; no sign of jaundice Psychiatric - alert and oriented x 4; calm mood and affect     Labs, Imaging and Diagnostic Testing: [facility] 05/24/22 1 cm x 0.3 cm left breast at 1200 2 cmfn Intraductal papilloma   Assessment and Plan:  Diagnoses and all orders for this visit:   Nipple discharge, bloody   Intraductal papilloma of breast, left     Left breast radioactive seed localized lumpectomy. The surgical procedure has been discussed with the patient.  Potential risks, benefits, alternative treatments, and expected outcomes have been explained.  All of the patient's questions at this time have been answered.  The likelihood of reaching the patient's treatment goal is good.  The patient understand the proposed surgical procedure and wishes to proceed.       Tationa Stech Delbert Harness, MD  06/13/2022 11:26 AM

## 2022-06-13 NOTE — Telephone Encounter (Signed)
   Pre-operative Risk Assessment    Patient Name: [name]  DOB: 15-Jun-1940 MRN: [id number]      Request for Surgical Clearance    Procedure:   Left breast radioactive seed localization lumpectomy  Date of Surgery:  Clearance TBD                                 Surgeon:  Dr. Manus Rudd Surgeon's Group or Practice Name:  [facility] Phone number: [phone]  Fax number:  [phone]   Type of Clearance Requested:   - Medical  - Pharmacy:  Hold Aspirin and Clopidogrel (Plavix) Not Indicated   Type of Anesthesia:  General    Additional requests/questions:    Signed, Emmit Pomfret   06/13/2022, 11:33 AM

## 2022-06-13 NOTE — H&P (View-Only) (Signed)
Subjective    Chief Complaint: New Consultation (Left breast papilloma)       History of Present Illness: [name] is a 82 y.o. female who is seen today as an office consultation at the request of Dr. Tisovec for evaluation of New Consultation (Left breast papilloma) .     This is an 82-year-old female female with a history of bilateral retropectoral saline implants who recently developed spontaneous left nipple discharge that occasionally appears bloody.  She underwent workup including mammogram and ultrasound.  She has a 1.0 x 0.3 cm circumscribed mass in the left breast at 12:00 2 cm from the nipple.  She underwent biopsy of this that revealed a intraductal papilloma.  No other suspicious findings.     Review of Systems: A complete review of systems was obtained from the patient.  I have reviewed this information and discussed as appropriate with the patient.  See HPI as well for other ROS.   Review of Systems  Constitutional: Negative.   HENT: Negative.    Eyes: Negative.   Respiratory: Negative.    Cardiovascular: Negative.   Gastrointestinal: Negative.   Genitourinary: Negative.   Musculoskeletal: Negative.   Skin: Negative.   Neurological: Negative.   Endo/Heme/Allergies: Negative.   Psychiatric/Behavioral: Negative.          Medical History: Past Medical History      Past Medical History:  Diagnosis Date   Diabetes mellitus without complication (CMS/HHS-HCC)     Hypertension          Problem List     Patient Active Problem List  Diagnosis   Nipple discharge, bloody   Coronary atherosclerosis   Esophageal reflux   Hyperlipidemia   Hypertension        Past Surgical History  History reviewed. No pertinent surgical history.      Allergies       Allergies  Allergen Reactions   Erythromycin Other (See Comments)      Mouth ulcers        Medications Ordered Prior to Encounter        Current Outpatient Medications on File Prior to Visit   Medication Sig Dispense Refill   carvediloL (COREG) 6.25 MG tablet Take 6.25 mg by mouth every 12 (twelve) hours       clopidogreL (PLAVIX) 75 mg tablet Take 75 mg by mouth once daily       empagliflozin (JARDIANCE) 25 mg tablet Take 25 mg by mouth once daily       estradioL (CLIMARA) 0.025 mg/24 hr patch Place 1 patch onto the skin once a week       losartan (COZAAR) 25 MG tablet Take 25 mg by mouth once daily       metFORMIN (GLUCOPHAGE) 500 MG tablet TAKE 1 TABLET BY MOUTH TWICE DAILY WITH FOOD 90       OZEMPIC 0.25 mg or 0.5 mg (2 mg/3 mL) pen injector TAKE 0.5MG SUBCUTANEOUS ONCE WEEKLY 28 DAYS       rosuvastatin (CRESTOR) 10 MG tablet Take 10 mg by mouth once daily        No current facility-administered medications on file prior to visit.        Family History  History reviewed. No pertinent family history.      Tobacco Use History  Social History       Tobacco Use  Smoking Status Never  Smokeless Tobacco Never        Social History  Social History          Socioeconomic History   Marital status: Married  Tobacco Use   Smoking status: Never   Smokeless tobacco: Never  Substance and Sexual Activity   Alcohol use: Not Currently   Drug use: Never        Objective:         Vitals:    06/13/22 0907  BP: 117/70  Pulse: 79  Temp: 36.6 C (97.9 F)  SpO2: 98%  Weight: 77.1 kg (170 lb)  Height: 166.4 cm (5' 5.5")  PainSc: 0-No pain    Body mass index is 27.86 kg/m.   Physical Exam    Constitutional:  WDWN in NAD, conversant, no obvious deformities; lying in bed comfortably Eyes:  Pupils equal, round; sclera anicteric; moist conjunctiva; no lid lag HENT:  Oral mucosa moist; good dentition  Neck:  No masses palpated, trachea midline; no thyromegaly Lungs:  CTA bilaterally; normal respiratory effort Breasts:  symmetric, no nipple changes; bilateral implants; no palpable masses or lymphadenopathy on either side CV:  Regular rate and rhythm; no murmurs;  extremities well-perfused with no edema Abd:  +bowel sounds, soft, non-tender, no palpable organomegaly; no palpable hernias Musc:  Normal gait; no apparent clubbing or cyanosis in extremities Lymphatic:  No palpable cervical or axillary lymphadenopathy Skin:  Warm, dry; no sign of jaundice Psychiatric - alert and oriented x 4; calm mood and affect     Labs, Imaging and Diagnostic Testing: [facility] 05/24/22 1 cm x 0.3 cm left breast at 1200 2 cmfn Intraductal papilloma   Assessment and Plan:  Diagnoses and all orders for this visit:   Nipple discharge, bloody   Intraductal papilloma of breast, left     Left breast radioactive seed localized lumpectomy. The surgical procedure has been discussed with the patient.  Potential risks, benefits, alternative treatments, and expected outcomes have been explained.  All of the patient's questions at this time have been answered.  The likelihood of reaching the patient's treatment goal is good.  The patient understand the proposed surgical procedure and wishes to proceed.       Jandiel Magallanes KAI Soley Harriss, MD  06/13/2022 11:26 AM   

## 2022-06-14 DIAGNOSIS — M25511 Pain in right shoulder: Secondary | ICD-10-CM | POA: Diagnosis not present

## 2022-06-14 DIAGNOSIS — M542 Cervicalgia: Secondary | ICD-10-CM | POA: Diagnosis not present

## 2022-06-14 NOTE — Progress Notes (Unsigned)
Cardiology Clinic Note   Patient Name: [name] Date of Encounter: 06/16/2022  Primary Care Provider:  Gaspar Garbe, MD Primary Cardiologist:  Peter Swaziland, MD  Patient Profile    [name] 82 year old female presents the clinic today for follow-up evaluation of her coronary artery disease and preoperative cardiac evaluation.  Past Medical History    Past Medical History:  Diagnosis Date   CAD (coronary artery disease)    Cataract    Chronic cough    Diabetes mellitus    Endometriosis    GERD (gastroesophageal reflux disease)    History of heart artery stent    Hyperlipidemia    Hypertension    OA (osteoarthritis)    Obesity    Past Surgical History:  Procedure Laterality Date   ANGIOPLASTY     APPENDECTOMY     BREAST ENHANCEMENT SURGERY  age 89   CARDIAC CATHETERIZATION  07/17/2006   CARDIOVASCULAR STRESS TEST  03/31/2009   EF 72%   CATARACT EXTRACTION  2018   CHOLECYSTECTOMY     heart stent     LEFT HEART CATHETERIZATION WITH CORONARY ANGIOGRAM N/A 10/09/2012   Procedure: LEFT HEART CATHETERIZATION WITH CORONARY ANGIOGRAM;  Surgeon: Peter M Swaziland, MD;  Location: [facility] CATH LAB;  Service: Cardiovascular;  Laterality: N/A;   TONSILLECTOMY     VAGINAL HYSTERECTOMY  1973    Allergies  Allergies  Allergen Reactions   Erythromycin Other (See Comments)    Mouth ulcers    History of Present Illness    [name] has a PMH of coronary artery disease, HLD, HTN, type 2 diabetes and GERD.  She underwent cardiac catheterization in 2008 with PCI and stenting to her LAD.  Stress testing 2014 was abnormal.  She had subsequent cardiac catheterization 9/14 which showed nonobstructive coronary disease and patent stenting with normal LVEF.  Her carotid Dopplers 11/18 showed mild carotid plaque but no stenosis.  She followed up with Dr. Swaziland routinely and was seen 04/07/2021 by Robin Searing, NP for palpitations.  BMP, CBC, TSH were unremarkable  with the exception of mild dehydration.  Cardiac event monitor was ordered and showed predominant normal sinus rhythm 70 bpm, first-degree AV block, 2 runs of SVT, rare PVCs and PACs with a burden of less than 1%.  She followed up with Gillian Shields, NP-C on 05/20/2021.  She reported that she enjoyed gardening in her spare time.  She reported feeling well since last being seen.  She had no recurrent palpitations.  Her cardiac event monitor was reviewed and she was reassured.  She reported that she had transition from [place] to [place] due to insurance reasons.  She denied chest pain shortness of breath.  She presents to the clinic today for follow-up evaluation and preoperative cardiac evaluation.  She states she feels she is doing well for her age.  She occasionally walks her dog but does not have formal exercise.  She lives in a two-story house and does not have any trouble walking the stairs.  We reviewed her previous clinic visit and lab work.  She expressed understanding.  She denies palpitations.  She is tolerating her medications well and denies side effects.  Her EKG today shows sinus rhythm with first-degree AV block left axis deviation 69 bpm.  Her blood pressure is well-controlled.  We will plan follow-up in 12 months.  I have asked her to increase her physical activity as tolerated.  She denies chest pain shortness of breath.  Home Medications    Prior to Admission medications   Medication Sig Start Date End Date Taking? Authorizing Provider  aspirin EC 81 MG tablet Take 81 mg by mouth daily.    [provider]  canagliflozin (INVOKANA) 300 MG TABS tablet Take by mouth.    [provider]  carvedilol (COREG) 6.25 MG tablet Take 1 tablet (6.25 mg total) by mouth 2 (two) times daily. 10/20/20   Swaziland, Peter M, MD  clopidogrel (PLAVIX) 75 MG tablet Take 1 tablet (75 mg total) by mouth daily. 07/22/21   Alver Sorrow, NP  empagliflozin (JARDIANCE) 25 MG TABS tablet  Take 25 mg by mouth daily. Patient not taking: Reported on 05/20/2021    [provider]  estradiol (VIVELLE-DOT) 0.05 MG/24HR patch Place 1 patch (0.05 mg total) onto the skin 2 (two) times a week. 11/26/18   Fontaine, Nadyne Coombes, MD  Ibuprofen-diphenhydrAMINE Cit (ADVIL PM PO) Take by mouth at bedtime.    [provider]  losartan (COZAAR) 25 MG tablet Take 1 tablet (25 mg total) by mouth daily. 10/08/21   Swaziland, Peter M, MD  MAGNESIUM PO Take by mouth.    [provider]  METFORMIN HCL PO Take 500 mg by mouth 2 (two) times daily.      [provider]  omeprazole (PRILOSEC) 20 MG capsule Take 20 mg by mouth daily.      [provider]  ondansetron (ZOFRAN) 4 MG tablet Take 1 tablet (4 mg total) by mouth every 6 (six) hours. 01/26/21   Curatolo, Adam, DO  ONETOUCH VERIO test strip 1 strip by Other route daily. Use 1 strip to check glucose once a day 07/21/14   [provider]  rosuvastatin (CRESTOR) 10 MG tablet Take 10 mg by mouth daily.      [provider]  TOUJEO SOLOSTAR 300 UNIT/ML SOPN Inject 80 Units as directed daily.  01/24/14   [provider]    Family History    Family History  Problem Relation Age of Onset   Alcohol abuse Mother    Diabetes Maternal Grandmother    Hypertension Maternal Grandmother    Lung cancer Father    She indicated that her mother is deceased. She indicated that the status of her father is unknown. She indicated that her maternal grandmother is deceased. She indicated that her maternal grandfather is deceased. She indicated that her paternal grandmother is deceased. She indicated that her paternal grandfather is deceased.  Social History    Social History   Socioeconomic History   Marital status: Married    Spouse name: Not on file   Number of children: 2   Years of education: Not on file   Highest education level: Not on file  Occupational History   Occupation: Environmental education officer  Tobacco Use   Smoking status: Never   Smokeless tobacco: Never  Vaping Use   Vaping Use: Never used  Substance and Sexual Activity   Alcohol use: Yes    Alcohol/week: 0.0 standard drinks of alcohol    Comment: Rare   Drug use: No   Sexual activity: Not Currently    Birth control/protection: Post-menopausal, Surgical    Comment: 1st intercourse 82 yo-Fewer than 5 partners,des neg  Other Topics Concern   Not on file  Social History Narrative   Not on file   Social Determinants of Health   Financial Resource Strain: Not on file  Food Insecurity: Not on file  Transportation Needs: Not  on file  Physical Activity: Not on file  Stress: Not on file  Social Connections: Not on file  Intimate Partner Violence: Not on file     Review of Systems    General:  No chills, fever, night sweats or weight changes.  Cardiovascular:  No chest pain, dyspnea on exertion, edema, orthopnea, palpitations, paroxysmal nocturnal dyspnea. Dermatological: No rash, lesions/masses Respiratory: No cough, dyspnea Urologic: No hematuria, dysuria Abdominal:   No nausea, vomiting, diarrhea, bright red blood per rectum, melena, or hematemesis Neurologic:  No visual changes, wkns, changes in mental status. All other systems reviewed and are otherwise negative except as noted above.  Physical Exam    VS:  BP 126/68 (BP Location: Left Arm, Patient Position: Sitting, Cuff Size: Normal)   Pulse 69   Ht 5\' 5"  (1.651 m)   Wt 170 lb (77.1 kg)   BMI 28.29 kg/m  , BMI Body mass index is 28.29 kg/m. GEN: Well nourished, well developed, in no acute distress. HEENT: normal. Neck: Supple, no JVD, carotid bruits, or masses. Cardiac: RRR, no murmurs, rubs, or gallops. No clubbing, cyanosis, edema.  Radials/DP/PT 2+ and equal bilaterally.  Respiratory:  Respirations regular and unlabored, clear to auscultation bilaterally. GI: Soft, nontender, nondistended, BS + x 4. MS: no deformity or  atrophy. Skin: warm and dry, no rash. Neuro:  Strength and sensation are intact. Psych: Normal affect.  Accessory Clinical Findings    Recent Labs: No results found for requested labs within last 365 days.   Recent Lipid Panel No results found for: "CHOL", "TRIG", "HDL", "CHOLHDL", "VLDL", "LDLCALC", "LDLDIRECT"       ECG personally reviewed by me today-normal sinus rhythm with first-degree AV block left axis deviation 69 bpm- No acute changes  Cardiac event monitor 3/23 Patient had a min HR of 52 bpm, max HR of 132 bpm, and avg HR of 70 bpm. Predominant underlying rhythm was Sinus Rhythm. First Degree AV Block was present. 2 Supraventricular Tachycardia runs occurred, the run with the fastest interval lasting 17 beats  with a max rate of 132 bpm (avg 107 bpm); the run with the fastest interval was also the longest. Isolated SVEs were rare (<1.0%), SVE Couplets were rare (<1.0%), and SVE Triplets were rare (<1.0%). Isolated VEs were rare (<1.0%), VE Couplets were rare  (<1.0%), and no VE Triplets were present. Ventricular Bigeminy and Trigeminy were present.   Echocardiogram 04/21/2021   IMPRESSIONS     1. Left ventricular ejection fraction, by estimation, is 60 to 65%. The  left ventricle has normal function. The left ventricle has no regional  wall motion abnormalities. There is mild left ventricular hypertrophy.  Left ventricular diastolic parameters  are consistent with Grade I diastolic dysfunction (impaired relaxation).   2. Right ventricular systolic function is normal. The right ventricular  size is normal. There is normal pulmonary artery systolic pressure.   3. The mitral valve is abnormal. Trivial mitral valve regurgitation.   4. The aortic valve is tricuspid. Aortic valve regurgitation is not  visualized.   5. The inferior vena cava is normal in size with greater than 50%  respiratory variability, suggesting right atrial pressure of 3 mmHg.   Comparison(s): No  prior Echocardiogram.   FINDINGS   Left Ventricle: Left ventricular ejection fraction, by estimation, is 60  to 65%. The left ventricle has normal function. The left ventricle has no  regional wall motion abnormalities. The left ventricular internal cavity  size was normal in size. There is  mild left ventricular hypertrophy. Left ventricular diastolic parameters  are consistent with Grade I diastolic dysfunction (impaired relaxation).  Indeterminate filling pressures.   Right Ventricle: The right ventricular size is normal. No increase in  right ventricular wall thickness. Right ventricular systolic function is  normal. There is normal pulmonary artery systolic pressure. The tricuspid  regurgitant velocity is 2.55 m/s, and   with an assumed right atrial pressure of 3 mmHg, the estimated right  ventricular systolic pressure is 29.0 mmHg.   Left Atrium: Left atrial size was normal in size.   Right Atrium: Right atrial size was normal in size.   Pericardium: There is no evidence of pericardial effusion.   Mitral Valve: The mitral valve is abnormal. There is mild thickening of  the anterior and posterior mitral valve leaflet(s). Mild mitral annular  calcification. Trivial mitral valve regurgitation.   Tricuspid Valve: The tricuspid valve is grossly normal. Tricuspid valve  regurgitation is trivial.   Aortic Valve: The aortic valve is tricuspid. Aortic valve regurgitation is  not visualized. Aortic valve mean gradient measures 2.0 mmHg. Aortic valve  peak gradient measures 3.8 mmHg. Aortic valve area, by VTI measures 2.21  cm.   Pulmonic Valve: The pulmonic valve was normal in structure. Pulmonic valve  regurgitation is not visualized.   Aorta: The aortic root and ascending aorta are structurally normal, with  no evidence of dilitation.   Venous: The inferior vena cava is normal in size with greater than 50%  respiratory variability, suggesting right atrial pressure of 3  mmHg.   IAS/Shunts: No atrial level shunt detected by color flow Doppler.   Assessment & Plan   1.  Coronary artery disease-no chest pain today.  Denies recent episodes of arm neck back or chest discomfort.  Occasionally walks her dog. Continue carvedilol, clopidogrel, ASA, rosuvastatin Heart healthy low-sodium high-fiber diet Maintain physical activity  Hyperlipidemia-compliant with rosuvastatin High-fiber diet  Essential hypertension-BP today 126/68. Continue current medical therapy Maintain blood pressure log Low-sodium diet  Palpitations-denies accelerated or irregular heartbeats.  Cardiac event monitor reassuring.  Details above. Maintain p.o. hydration, avoid triggers Continue to monitor  Preoperative cardiac evaluation-left breast radioactive seed localization lumpectomy, Dr. Corliss Skains, [place] [facility], fax #[phone]     Primary Cardiologist: Peter Swaziland, MD  Chart reviewed as part of pre-operative protocol coverage. Given past medical history and time since last visit, based on ACC/AHA guidelines, [name] would be at acceptable risk for the planned procedure without further cardiovascular testing.   Her aspirin may be held for 5 to 7 days prior to her procedure and her Plavix may be held for 5 days prior to her procedure.  Please resume as soon as hemostasis is achieved.  Her RCRI is a class IV risk, 11% risk of major cardiac event.  She is able to complete greater than 4 METS of physical activity.  Patient was advised that if she develops new symptoms prior to surgery to contact our office to arrange a follow-up appointment.  She verbalized understanding.  I will route this recommendation to the requesting party via Epic fax function and remove from pre-op pool.    Disposition: Follow-up with Dr. Swaziland or me in 12 months.   Thomasene Ripple. Faun Mcqueen NP-C     06/16/2022, 11:58 AM [facility] [address] Office  [phone] Fax [phone]    I spent 14 minutes examining this patient, reviewing medications, and using patient centered shared decision making involving her cardiac  care.  Prior to her visit I spent greater than 20 minutes reviewing her past medical history,  medications, and prior cardiac tests.

## 2022-06-16 ENCOUNTER — Encounter: Payer: Self-pay | Admitting: General Practice

## 2022-06-16 ENCOUNTER — Ambulatory Visit: Payer: HMO | Attending: General Practice | Admitting: General Practice

## 2022-06-16 VITALS — BP 126/68 | HR 69 | Ht 65.0 in | Wt 170.0 lb

## 2022-06-16 DIAGNOSIS — I499 Cardiac arrhythmia, unspecified: Secondary | ICD-10-CM

## 2022-06-16 DIAGNOSIS — I10 Essential (primary) hypertension: Secondary | ICD-10-CM | POA: Diagnosis not present

## 2022-06-16 DIAGNOSIS — E782 Mixed hyperlipidemia: Secondary | ICD-10-CM | POA: Diagnosis not present

## 2022-06-16 DIAGNOSIS — I25118 Atherosclerotic heart disease of native coronary artery with other forms of angina pectoris: Secondary | ICD-10-CM | POA: Diagnosis not present

## 2022-06-16 DIAGNOSIS — Z01810 Encounter for preprocedural cardiovascular examination: Secondary | ICD-10-CM | POA: Diagnosis not present

## 2022-06-16 NOTE — Patient Instructions (Signed)
Medication Instructions:  Your physician recommends that you continue on your current medications as directed. Please refer to the Current Medication list given to you today.  *If you need a refill on your cardiac medications before your next appointment, please call your pharmacy*   Lab Work: NONE ordered at this time of appointment   If you have labs (blood work) drawn today and your tests are completely normal, you will receive your results only by: MyChart Message (if you have MyChart) OR A paper copy in the mail If you have any lab test that is abnormal or we need to change your treatment, we will call you to review the results.   Testing/Procedures: NONE ordered at this time of appointment     Follow-Up: At [facility], you and your health needs are our priority.  As part of our continuing mission to provide you with exceptional heart care, we have created designated Provider Care Teams.  These Care Teams include your primary Cardiologist (physician) and Advanced Practice Providers (APPs -  Physician Assistants and Nurse Practitioners) who all work together to provide you with the care you need, when you need it.  We recommend signing up for the patient portal called "MyChart".  Sign up information is provided on this After Visit Summary.  MyChart is used to connect with patients for Virtual Visits (Telemedicine).  Patients are able to view lab/test results, encounter notes, upcoming appointments, etc.  Non-urgent messages can be sent to your provider as well.   To learn more about what you can do with MyChart, go to [email or web address].    Your next appointment:   1 year(s)  Provider:   Peter Swaziland, MD     Other Instructions Increase physical activity. Start walking for 15 mins daily.

## 2022-06-27 ENCOUNTER — Other Ambulatory Visit: Payer: Self-pay

## 2022-06-27 ENCOUNTER — Emergency Department (HOSPITAL_BASED_OUTPATIENT_CLINIC_OR_DEPARTMENT_OTHER): Payer: PPO | Admitting: Radiology

## 2022-06-27 ENCOUNTER — Encounter (HOSPITAL_BASED_OUTPATIENT_CLINIC_OR_DEPARTMENT_OTHER): Payer: Self-pay | Admitting: Emergency Medicine

## 2022-06-27 ENCOUNTER — Emergency Department (HOSPITAL_BASED_OUTPATIENT_CLINIC_OR_DEPARTMENT_OTHER)
Admission: EM | Admit: 2022-06-27 | Discharge: 2022-06-28 | Disposition: A | Discharge status: Home / Self Care | Payer: PPO | Attending: Emergency Medicine | Admitting: Emergency Medicine

## 2022-06-27 DIAGNOSIS — I10 Essential (primary) hypertension: Secondary | ICD-10-CM | POA: Diagnosis not present

## 2022-06-27 DIAGNOSIS — R000 Tachycardia, unspecified: Secondary | ICD-10-CM | POA: Diagnosis not present

## 2022-06-27 DIAGNOSIS — R7989 Other specified abnormal findings of blood chemistry: Secondary | ICD-10-CM | POA: Insufficient documentation

## 2022-06-27 DIAGNOSIS — R0789 Other chest pain: Secondary | ICD-10-CM | POA: Diagnosis not present

## 2022-06-27 DIAGNOSIS — E119 Type 2 diabetes mellitus without complications: Secondary | ICD-10-CM | POA: Diagnosis not present

## 2022-06-27 DIAGNOSIS — I2510 Atherosclerotic heart disease of native coronary artery without angina pectoris: Secondary | ICD-10-CM | POA: Diagnosis not present

## 2022-06-27 DIAGNOSIS — R079 Chest pain, unspecified: Secondary | ICD-10-CM | POA: Diagnosis not present

## 2022-06-27 DIAGNOSIS — R002 Palpitations: Secondary | ICD-10-CM | POA: Diagnosis not present

## 2022-06-27 DIAGNOSIS — R778 Other specified abnormalities of plasma proteins: Secondary | ICD-10-CM | POA: Diagnosis not present

## 2022-06-27 LAB — BASIC METABOLIC PANEL
Anion gap: 11 (ref 5–15)
BUN: 15 mg/dL (ref 8–23)
CO2: 25 mmol/L (ref 22–32)
Calcium: 9.7 mg/dL (ref 8.9–10.3)
Chloride: 102 mmol/L (ref 98–111)
Creatinine, Ser: 0.49 mg/dL (ref 0.44–1.00)
GFR, Estimated: >60 mL/min (ref >60)
Glucose, Bld: 166 mg/dL — ABNORMAL HIGH (ref 70–99)
Potassium: 4.1 mmol/L (ref 3.5–5.1)
Sodium: 138 mmol/L (ref 135–145)

## 2022-06-27 LAB — CBC
HCT: 46.1 % — ABNORMAL HIGH (ref 36.0–46.0)
Hemoglobin: 15.4 g/dL — ABNORMAL HIGH (ref 12.0–15.0)
MCH: 29.8 pg (ref 26.0–34.0)
MCHC: 33.4 g/dL (ref 30.0–36.0)
MCV: 89.2 fL (ref 80.0–100.0)
Platelets: 230 10*3/uL (ref 150–400)
RBC: 5.17 MIL/uL — ABNORMAL HIGH (ref 3.87–5.11)
RDW: 14.3 % (ref 11.5–15.5)
WBC: 9.5 10*3/uL (ref 4.0–10.5)
nRBC: 0 % (ref 0.0–0.2)

## 2022-06-27 LAB — D-DIMER, QUANTITATIVE: D-Dimer, Quant: <0.27 ug/mL-FEU (ref 0.00–0.50)

## 2022-06-27 LAB — TSH: TSH: 1.676 u[IU]/mL (ref 0.350–4.500)

## 2022-06-27 LAB — MAGNESIUM: Magnesium: 1.9 mg/dL (ref 1.7–2.4)

## 2022-06-27 LAB — TROPONIN I (HIGH SENSITIVITY)
Troponin I (High Sensitivity): 3 ng/L (ref <18)
Troponin I (High Sensitivity): 12 ng/L (ref <18)
Troponin I (High Sensitivity): 25 ng/L — ABNORMAL HIGH (ref <18)

## 2022-06-27 MED ORDER — EMPAGLIFLOZIN 25 MG PO TABS
25.0000 mg | ORAL_TABLET | Freq: Every day | ORAL | Status: DC
  Filled 2022-06-27: qty 1

## 2022-06-27 MED ORDER — CLOPIDOGREL BISULFATE 75 MG PO TABS: 75.0000 mg | ORAL_TABLET | Freq: Every day | ORAL | Status: DC

## 2022-06-27 MED ORDER — CARVEDILOL 6.25 MG PO TABS
6.2500 mg | ORAL_TABLET | Freq: Two times a day (BID) | ORAL | Status: DC
  Administered 2022-06-28: 6.25 mg via ORAL
  Filled 2022-06-27: qty 1

## 2022-06-27 MED ORDER — INSULIN GLARGINE-YFGN 100 UNIT/ML ~~LOC~~ SOLN
66.0000 [IU] | Freq: Every day | SUBCUTANEOUS | Status: DC
  Administered 2022-06-28: 66 [IU] via SUBCUTANEOUS
  Filled 2022-06-27: qty 660

## 2022-06-27 MED ORDER — LOSARTAN POTASSIUM 25 MG PO TABS: 25.0000 mg | ORAL_TABLET | Freq: Every day | ORAL | Status: DC

## 2022-06-27 NOTE — ED Notes (Signed)
ED Provider at bedside. 

## 2022-06-27 NOTE — ED Provider Notes (Addendum)
[place] EMERGENCY DEPARTMENT AT [facility] Provider Note   CSN: [id number] Arrival date & time: 06/27/22  1834     History  Chief Complaint  Patient presents with   Chest Pain    [name] is a 82 y.o. female.   Chest Pain Associated symptoms: palpitations      82 year old female with medical history significant for coronary artery disease, GERD, HTN, HLD, DM2 who presents to the emergency department with chest pressure and palpitations.  The patient states that she was at home around 5 PM when she is experienced a sudden onset of chest pressure and palpitations.  She states that her Apple Watch read her heart rate in the 180s.  She had been feeling some lightheadedness since she woke up this morning.  She presented to the emergency room for further evaluation upon which time her palpitations had resolved and her chest pressure had since resolved.  She felt near syncopal during the entire event.  On review, the patient is followed outpatient with cardiology and has previously had Holter monitor placement with noted runs of SVT.  She last underwent cardiac catheterization in 2008 with PCI and stenting to her LAD.  She had carotid Dopplers in 11/18 which showed mild carotid plaque but no stenosis.  She last followed up with cardiology in clinic on 06/16/2022.  Home Medications Prior to Admission medications   Medication Sig Start Date End Date Taking? Authorizing Provider  carvedilol (COREG) 6.25 MG tablet Take 1 tablet (6.25 mg total) by mouth 2 (two) times daily. 10/20/20   [place], Peter M, MD  clopidogrel (PLAVIX) 75 MG tablet Take 1 tablet (75 mg total) by mouth daily. 07/22/21   Alver Sorrow, NP  empagliflozin (JARDIANCE) 25 MG TABS tablet Take 25 mg by mouth daily.    [provider]  estradiol (VIVELLE-DOT) 0.05 MG/24HR patch Place 1 patch (0.05 mg total) onto the skin 2 (two) times a week. 11/26/18   Fontaine, Nadyne Coombes, MD   Ibuprofen-diphenhydrAMINE Cit (ADVIL PM PO) Take by mouth at bedtime.    [provider]  losartan (COZAAR) 25 MG tablet Take 1 tablet (25 mg total) by mouth daily. 10/08/21   [place], Peter M, MD  MAGNESIUM PO Take by mouth.    [provider]  METFORMIN HCL PO Take 500 mg by mouth 2 (two) times daily.      [provider]  omeprazole (PRILOSEC) 20 MG capsule Take 20 mg by mouth daily.      [provider]  [facility] VERIO test strip 1 strip by Other route daily. Use 1 strip to check glucose once a day 07/21/14   [provider]  rosuvastatin (CRESTOR) 10 MG tablet Take 10 mg by mouth daily.      [provider]  Semaglutide,0.25 or 0.5MG /DOS, (OZEMPIC, 0.25 OR 0.5 MG/DOSE,) 2 MG/3ML SOPN TAKE 0.5MG  SUBCUTANEOUS ONCE WEEKLY 28 DAYS 02/04/22   [provider]  TOUJEO SOLOSTAR 300 UNIT/ML SOPN Inject 66 Units as directed daily. 01/24/14   [provider]      Allergies    Erythromycin    Review of Systems   Review of Systems  Cardiovascular:  Positive for chest pain and palpitations.  Neurological:  Positive for light-headedness.  All other systems reviewed and are negative.   Physical Exam Updated Vital Signs BP 131/70   Pulse 71   Temp (!) 97.5 F (36.4 C) (Oral)   Resp (!) 22   Ht 5\' 5"  (  1.651 m)   Wt 77.1 kg   SpO2 96%   BMI 28.29 kg/m  Physical Exam Vitals and nursing note reviewed.  Constitutional:      General: She is not in acute distress.    Appearance: She is well-developed.  HENT:     Head: Normocephalic and atraumatic.  Eyes:     Conjunctiva/sclera: Conjunctivae normal.  Cardiovascular:     Rate and Rhythm: Normal rate and regular rhythm.  Pulmonary:     Effort: Pulmonary effort is normal. No respiratory distress.     Breath sounds: Normal breath sounds.  Abdominal:     Palpations: Abdomen is soft.     Tenderness: There is no abdominal tenderness.  Musculoskeletal:        General: No  swelling.     Cervical back: Neck supple.     Right lower leg: No edema.     Left lower leg: No edema.  Skin:    General: Skin is warm and dry.     Capillary Refill: Capillary refill takes less than 2 seconds.  Neurological:     Mental Status: She is alert.  Psychiatric:        Mood and Affect: Mood normal.     ED Results / Procedures / Treatments   Labs (all labs ordered are listed, but only abnormal results are displayed) Labs Reviewed  BASIC METABOLIC PANEL - Abnormal; Notable for the following components:      Result Value   Glucose, Bld 166 (*)    All other components within normal limits  CBC - Abnormal; Notable for the following components:   RBC 5.17 (*)    Hemoglobin 15.4 (*)    HCT 46.1 (*)    All other components within normal limits  TROPONIN I (HIGH SENSITIVITY) - Abnormal; Notable for the following components:   Troponin I (High Sensitivity) 25 (*)    All other components within normal limits  D-DIMER, QUANTITATIVE  TSH  MAGNESIUM  T4, FREE  TROPONIN I (HIGH SENSITIVITY)  TROPONIN I (HIGH SENSITIVITY)    EKG None  Radiology DG Chest 2 View  Result Date: 06/27/2022 CLINICAL DATA:  Chest pain tachycardia EXAM: CHEST - 2 VIEW COMPARISON:  01/26/2021 FINDINGS: The heart size and mediastinal contours are within normal limits. Both lungs are clear. The visualized skeletal structures are unremarkable. Bilateral breast implants are noted. IMPRESSION: No active cardiopulmonary disease. Electronically Signed   By: Alcide Clever M.D.   On: 06/27/2022 19:32    Procedures Procedures    Medications Ordered in ED Medications - No data to display  ED Course/ Medical Decision Making/ A&P                             Medical Decision Making Amount and/or Complexity of Data Reviewed Labs: ordered. Radiology: ordered.  Risk Decision regarding hospitalization.      82 year old female with medical history significant for coronary artery disease, GERD, HTN,  HLD, DM2 who presents to the emergency department with chest pressure and palpitations.  The patient states that she was at home around 5 PM when she is experienced a sudden onset of chest pressure and palpitations.  She states that her Apple Watch read her heart rate in the 180s.  She had been feeling some lightheadedness since she woke up this morning.  She presented to the emergency room for further evaluation upon which time her palpitations had resolved and her chest pressure  had since resolved.  She felt near syncopal during the entire event.  On review, the patient is followed outpatient with cardiology and has previously had Holter monitor placement with noted runs of SVT.  She last underwent cardiac catheterization in 2008 with PCI and stenting to her LAD.  She had carotid Dopplers in 11/18 which showed mild carotid plaque but no stenosis.  She last followed up with cardiology in clinic on 06/16/2022.  On arrival, the patient was afebrile, not tachycardic or tachypneic, mildly hypertensive BP 144/74, saturating 95% on room air.  Physical exam generally unremarkable.  Differential diagnosis includes SVT, sinus tachycardia, anxiety attack/panic attack, ACS, PE, other cardiac arrhythmia.  Additionally considered vasovagal near syncope and orthostatic hypotension.  The patient's EKG revealed sinus rhythm, prolonged PR interval with first-degree AV block with a PR of 292, no acute ischemic changes noted with no T wave inversions or ST segment changes, no other significantly abnormal intervals noted with a QTc of 415.  Chest x-ray revealed no active cardiopulmonary disease.  Laboratory evaluation revealed a magnesium of 1.9, initial cardiac troponin of 3, repeat uptrending to 12, D-dimer negative at 0.27, TSH normal, no significant electrolyte abnormality, normal renal function, CBC without a leukocytosis, evidence of mild hemoconcentration with a hemoglobin of 15.4.  Third troponin was collected and  resulted elevated at 25.  I have suspicion for a cardiac arrhythmia, possibly SVT given the patient's history however this was not confirmed on EKG or telemetry in the emergency department resulting in the patient's presentation of near syncope, palpitations and chest pain.  Additionally considered demand ischemia in the setting of the patient's heart rate of 180 which was confirmed on her Apple Watch.  Her Apple Watch was consistent with cardiac telemetry when she presented in triage.  The patient was monitored on telemetry in the emergency department with no further recurrence.  Given her episode of near syncope, palpitations, chest pressure, elevated troponin, elevated heart rate, I spoke with on-call cardiology, Dr. Juel Burrow regarding admission for observation and continued observation on cardiac telemetry.  He subsequently accepted the patient in observation and the patient was admitted in stable condition.   Final Clinical Impression(s) / ED Diagnoses Final diagnoses:  Palpitations  Chest pain, unspecified type  Elevated troponin    Rx / DC Orders ED Discharge Orders     None         Ernie Avena, MD 06/27/22 2340    Ernie Avena, MD 06/27/22 2341

## 2022-06-27 NOTE — ED Triage Notes (Signed)
Pt arrives to ED with c/o chest pain and tachycardia. Pt notes around 5pm the CP started and her apple watch said her heart was 180. She notes that she has been dizzy since she woke up this morning.

## 2022-06-28 LAB — T4, FREE: Free T4: 0.83 ng/dL (ref 0.61–1.12)

## 2022-06-28 LAB — CBG MONITORING, ED
Glucose-Capillary: 103 mg/dL — ABNORMAL HIGH (ref 70–99)
Glucose-Capillary: 81 mg/dL (ref 70–99)
Glucose-Capillary: 81 mg/dL (ref 70–99)

## 2022-06-28 MED ORDER — PROPRANOLOL HCL 10 MG PO TABS: 10.0000 mg | ORAL_TABLET | Freq: Four times a day (QID) | ORAL | 0 refills | Status: DC | PRN

## 2022-06-28 NOTE — ED Provider Notes (Signed)
  Physical Exam  BP (!) 140/70   Pulse 71   Temp 98 F (36.7 C) (Oral)   Resp (!) 24   Ht 5\' 5"  (1.651 m)   Wt 77.1 kg   SpO2 95%   BMI 28.29 kg/m   Physical Exam Vitals and nursing note reviewed.  Constitutional:      General: She is not in acute distress.    Appearance: She is well-developed.  Pulmonary:     Effort: Pulmonary effort is normal.  Musculoskeletal:     Cervical back: Normal range of motion.  Neurological:     Mental Status: She is alert and oriented to person, place, and time.  Psychiatric:        Mood and Affect: Mood normal.        Behavior: Behavior normal.     Procedures  Procedures  ED Course / MDM   Clinical Course as of 06/28/22 1009  Tue Jun 28, 2022  0923 No bed available at this time. Will plan to reach out to cardiology for disposition recommendations after overnight observation. [VK]  [id number] I spoke with cardiology who recommended propranolol 10 mg 4 times a day for SVT as needed as well as valsava and cold water reflex. They will schedule a follow up appointment prior to lumpectomy. Patient is agreeable with plan. She has remained asymptomatic and in a normal heart rhythm overnight. She is stable for discharge home with outpatient follow up and was given strict return precautions. [VK]    Clinical Course User Index [VK] Rexford Maus, DO   Medical Decision Making Patient was initially evaluated by the night team and found to have mildly elevated troponins in the setting of SVT.  The patient was having chest pain last night.  She reports throughout the night she has had few intermittent episodes of dizziness that last for few seconds at a time and resolves on its own but has had no chest pain or heart palpitations.  She was remained in a normal sinus rhythm on the monitor.  Her troponins were mildly uptrending from 12-25.  She does not currently have a bed available and we will evaluate for bed availability this morning and likely reach out  to cardiology for disposition recommendations should she not be getting a bed.  Amount and/or Complexity of Data Reviewed Labs: ordered. Radiology: ordered.  Risk Prescription drug management. Decision regarding hospitalization.         Elayne Snare K, DO 06/28/22 1009

## 2022-06-28 NOTE — Discharge Instructions (Addendum)
You were seen in the emergency department for your chest pain and your heart palpitations.  You are likely in SVT (supraventricular tachycardia) which is a rapid heart rate and rhythm.  If you feel like you are in SVT and especially if your Apple Watch is telling you that your heart rate is higher than 150 and you feel the palpitations you should first try some maneuvers to try to get you back into a normal rhythm.  You can try a Valsalva maneuver which is when you bear down like you are trying to have a bowel movement and hold this position for at least 5 to 10 seconds, you can try blowing into a syringe for at least 5 to 10 seconds or filling a bucket with ice water and placing your face in the ice to try to get yourself back into a normal rhythm.  If you continue to have a fast heart rate despite these maneuvers I have given you propranolol to take as needed.  You can take 1 tablet up to 4 times for SVT and after you take 1 tablet if you remain in SVT after a few minutes you can take another tablet and repeat this up to 4 times.  You can also check your blood pressure while you are in this rapid heart rate and if your blood pressure is very low and you should avoid the medications and come to the emergency department.  Cardiology will be scheduling you a follow-up appointment however if you do not hear from them in the next few days you should call to determine when your appointment will be.  They would like to see you in the office prior to your surgery in a couple of weeks.  You should return to the emergency department if you are having severe chest pain, you are having palpitations or rapid heart rate that does not resolve with maneuvers or the medications, you pass out or if you have any other new or concerning symptoms.

## 2022-06-28 NOTE — ED Notes (Signed)
Discharge instructions, follow up care with cardiology, and prescription reviewed and explained, pt verbalized understanding and had no further questions on d/c. Pt taken to daughter's car via wheelchair without incident.

## 2022-06-29 ENCOUNTER — Telehealth: Payer: Self-pay | Admitting: Cardiology

## 2022-06-29 NOTE — Telephone Encounter (Signed)
Spoke to the patient, stated she experiencing frequent dizziness, she did contact PCP she was advised to contact cardiology. Pt stated she had not eaten much toast this morning, sip of water, bs this morning was 94, rechecked while on the phone bs 115 and bp 119/74, HR 80's. Advised pt to increase her water intake and food intake. Pt did have f/u appointment on  6/14 with Edd Fabian, she will like to reschedule the appointment. Explained ED precautions, pt voiced understanding.

## 2022-06-29 NOTE — Telephone Encounter (Signed)
STAT if patient feels like he/she is going to faint   Are you dizzy now?  Yes, patient has been very dizzy today and could barely walk   Do you feel faint or have you passed out?  No   Do you have any other symptoms?  No   Have you checked your HR and BP (record if available)?   BP was around 119/74, HR was in the 80's today (estimate)

## 2022-06-30 NOTE — Telephone Encounter (Signed)
Spoke to patient Dr.Jordan's advice given.Appointment scheduled with Edd Fabian NP 6/12 at 8:50 am.

## 2022-07-03 NOTE — Progress Notes (Unsigned)
Cardiology Clinic Note   Patient Name: [name] Date of Encounter: 07/03/2022  Primary Care Provider:  Gaspar Garbe, MD Primary Cardiologist:  Peter Swaziland, MD  Patient Profile    [name] 82 year old female presents the clinic today for follow-up evaluation of her coronary artery disease and preoperative cardiac evaluation.  Past Medical History    Past Medical History:  Diagnosis Date   CAD (coronary artery disease)    Cataract    Chronic cough    Diabetes mellitus    Endometriosis    GERD (gastroesophageal reflux disease)    History of heart artery stent    Hyperlipidemia    Hypertension    OA (osteoarthritis)    Obesity    Past Surgical History:  Procedure Laterality Date   ANGIOPLASTY     APPENDECTOMY     BREAST ENHANCEMENT SURGERY  age 30   CARDIAC CATHETERIZATION  07/17/2006   CARDIOVASCULAR STRESS TEST  03/31/2009   EF 72%   CATARACT EXTRACTION  2018   CHOLECYSTECTOMY     heart stent     LEFT HEART CATHETERIZATION WITH CORONARY ANGIOGRAM N/A 10/09/2012   Procedure: LEFT HEART CATHETERIZATION WITH CORONARY ANGIOGRAM;  Surgeon: Peter M Swaziland, MD;  Location: [facility] CATH LAB;  Service: Cardiovascular;  Laterality: N/A;   TONSILLECTOMY     VAGINAL HYSTERECTOMY  1973    Allergies  Allergies  Allergen Reactions   Erythromycin Other (See Comments)    Mouth ulcers    History of Present Illness    [name] has a PMH of coronary artery disease, HLD, HTN, type 2 diabetes and GERD.  She underwent cardiac catheterization in 2008 with PCI and stenting to her LAD.  Stress testing 2014 was abnormal.  She had subsequent cardiac catheterization 9/14 which showed nonobstructive coronary disease and patent stenting with normal LVEF.  Her carotid Dopplers 11/18 showed mild carotid plaque but no stenosis.  She followed up with Dr. Swaziland routinely and was seen 04/07/2021 by Robin Searing, NP for palpitations.  BMP, CBC, TSH were unremarkable  with the exception of mild dehydration.  Cardiac event monitor was ordered and showed predominant normal sinus rhythm 70 bpm, first-degree AV block, 2 runs of SVT, rare PVCs and PACs with a burden of less than 1%.  She followed up with Gillian Shields, NP-C on 05/20/2021.  She reported that she enjoyed gardening in her spare time.  She reported feeling well since last being seen.  She had no recurrent palpitations.  Her cardiac event monitor was reviewed and she was reassured.  She reported that she had transition from [place] to [place] due to insurance reasons.  She denied chest pain shortness of breath.  She presents to the clinic today for follow-up evaluation and preoperative cardiac evaluation.  She states she feels she is doing well for her age.  She occasionally walks her dog but does not have formal exercise.  She lives in a two-story house and does not have any trouble walking the stairs.  We reviewed her previous clinic visit and lab work.  She expressed understanding.  She denies palpitations.  She is tolerating her medications well and denies side effects.  Her EKG today shows sinus rhythm with first-degree AV block left axis deviation 69 bpm.  Her blood pressure is well-controlled.  We will plan follow-up in 12 months.  I have asked her to increase her physical activity as tolerated.  She denies chest pain shortness of breath.  Home Medications    Prior to Admission medications   Medication Sig Start Date End Date Taking? Authorizing Provider  aspirin EC 81 MG tablet Take 81 mg by mouth daily.    [provider]  canagliflozin (INVOKANA) 300 MG TABS tablet Take by mouth.    [provider]  carvedilol (COREG) 6.25 MG tablet Take 1 tablet (6.25 mg total) by mouth 2 (two) times daily. 10/20/20   Swaziland, Peter M, MD  clopidogrel (PLAVIX) 75 MG tablet Take 1 tablet (75 mg total) by mouth daily. 07/22/21   Alver Sorrow, NP  empagliflozin (JARDIANCE) 25 MG TABS tablet  Take 25 mg by mouth daily. Patient not taking: Reported on 05/20/2021    [provider]  estradiol (VIVELLE-DOT) 0.05 MG/24HR patch Place 1 patch (0.05 mg total) onto the skin 2 (two) times a week. 11/26/18   Fontaine, Nadyne Coombes, MD  Ibuprofen-diphenhydrAMINE Cit (ADVIL PM PO) Take by mouth at bedtime.    [provider]  losartan (COZAAR) 25 MG tablet Take 1 tablet (25 mg total) by mouth daily. 10/08/21   Swaziland, Peter M, MD  MAGNESIUM PO Take by mouth.    [provider]  METFORMIN HCL PO Take 500 mg by mouth 2 (two) times daily.      [provider]  omeprazole (PRILOSEC) 20 MG capsule Take 20 mg by mouth daily.      [provider]  ondansetron (ZOFRAN) 4 MG tablet Take 1 tablet (4 mg total) by mouth every 6 (six) hours. 01/26/21   Curatolo, Adam, DO  ONETOUCH VERIO test strip 1 strip by Other route daily. Use 1 strip to check glucose once a day 07/21/14   [provider]  rosuvastatin (CRESTOR) 10 MG tablet Take 10 mg by mouth daily.      [provider]  TOUJEO SOLOSTAR 300 UNIT/ML SOPN Inject 80 Units as directed daily.  01/24/14   [provider]    Family History    Family History  Problem Relation Age of Onset   Alcohol abuse Mother    Diabetes Maternal Grandmother    Hypertension Maternal Grandmother    Lung cancer Father    She indicated that her mother is deceased. She indicated that the status of her father is unknown. She indicated that her maternal grandmother is deceased. She indicated that her maternal grandfather is deceased. She indicated that her paternal grandmother is deceased. She indicated that her paternal grandfather is deceased.  Social History    Social History   Socioeconomic History   Marital status: Married    Spouse name: Not on file   Number of children: 2   Years of education: Not on file   Highest education level: Not on file  Occupational History   Occupation: Environmental education officer  Tobacco Use   Smoking status: Never   Smokeless tobacco: Never  Vaping Use   Vaping Use: Never used  Substance and Sexual Activity   Alcohol use: Yes    Alcohol/week: 0.0 standard drinks of alcohol    Comment: Rare   Drug use: No   Sexual activity: Not Currently    Birth control/protection: Post-menopausal, Surgical    Comment: 1st intercourse 82 yo-Fewer than 5 partners,des neg  Other Topics Concern   Not on file  Social History Narrative   Not on file   Social Determinants of Health   Financial Resource Strain: Not on file  Food Insecurity: Not on file  Transportation Needs: Not  on file  Physical Activity: Not on file  Stress: Not on file  Social Connections: Not on file  Intimate Partner Violence: Not on file     Review of Systems    General:  No chills, fever, night sweats or weight changes.  Cardiovascular:  No chest pain, dyspnea on exertion, edema, orthopnea, palpitations, paroxysmal nocturnal dyspnea. Dermatological: No rash, lesions/masses Respiratory: No cough, dyspnea Urologic: No hematuria, dysuria Abdominal:   No nausea, vomiting, diarrhea, bright red blood per rectum, melena, or hematemesis Neurologic:  No visual changes, wkns, changes in mental status. All other systems reviewed and are otherwise negative except as noted above.  Physical Exam    VS:  There were no vitals taken for this visit. , BMI There is no height or weight on file to calculate BMI. GEN: Well nourished, well developed, in no acute distress. HEENT: normal. Neck: Supple, no JVD, carotid bruits, or masses. Cardiac: RRR, no murmurs, rubs, or gallops. No clubbing, cyanosis, edema.  Radials/DP/PT 2+ and equal bilaterally.  Respiratory:  Respirations regular and unlabored, clear to auscultation bilaterally. GI: Soft, nontender, nondistended, BS + x 4. MS: no deformity or atrophy. Skin: warm and dry, no rash. Neuro:  Strength and sensation are intact. Psych: Normal  affect.  Accessory Clinical Findings    Recent Labs: 06/27/2022: BUN 15; Creatinine, Ser 0.49; Hemoglobin 15.4; Magnesium 1.9; Platelets 230; Potassium 4.1; Sodium 138; TSH 1.676   Recent Lipid Panel No results found for: "CHOL", "TRIG", "HDL", "CHOLHDL", "VLDL", "LDLCALC", "LDLDIRECT"  No BP recorded.  {Refresh Note OR Click here to enter BP  :1}***    ECG personally reviewed by me today-normal sinus rhythm with first-degree AV block left axis deviation 69 bpm- No acute changes  Cardiac event monitor 3/23 Patient had a min HR of 52 bpm, max HR of 132 bpm, and avg HR of 70 bpm. Predominant underlying rhythm was Sinus Rhythm. First Degree AV Block was present. 2 Supraventricular Tachycardia runs occurred, the run with the fastest interval lasting 17 beats  with a max rate of 132 bpm (avg 107 bpm); the run with the fastest interval was also the longest. Isolated SVEs were rare (<1.0%), SVE Couplets were rare (<1.0%), and SVE Triplets were rare (<1.0%). Isolated VEs were rare (<1.0%), VE Couplets were rare  (<1.0%), and no VE Triplets were present. Ventricular Bigeminy and Trigeminy were present.   Echocardiogram 04/21/2021   IMPRESSIONS     1. Left ventricular ejection fraction, by estimation, is 60 to 65%. The  left ventricle has normal function. The left ventricle has no regional  wall motion abnormalities. There is mild left ventricular hypertrophy.  Left ventricular diastolic parameters  are consistent with Grade I diastolic dysfunction (impaired relaxation).   2. Right ventricular systolic function is normal. The right ventricular  size is normal. There is normal pulmonary artery systolic pressure.   3. The mitral valve is abnormal. Trivial mitral valve regurgitation.   4. The aortic valve is tricuspid. Aortic valve regurgitation is not  visualized.   5. The inferior vena cava is normal in size with greater than 50%  respiratory variability, suggesting right atrial pressure of 3  mmHg.   Comparison(s): No prior Echocardiogram.   FINDINGS   Left Ventricle: Left ventricular ejection fraction, by estimation, is 60  to 65%. The left ventricle has normal function. The left ventricle has no  regional wall motion abnormalities. The left ventricular internal cavity  size was normal in size. There is   mild left  ventricular hypertrophy. Left ventricular diastolic parameters  are consistent with Grade I diastolic dysfunction (impaired relaxation).  Indeterminate filling pressures.   Right Ventricle: The right ventricular size is normal. No increase in  right ventricular wall thickness. Right ventricular systolic function is  normal. There is normal pulmonary artery systolic pressure. The tricuspid  regurgitant velocity is 2.55 m/s, and   with an assumed right atrial pressure of 3 mmHg, the estimated right  ventricular systolic pressure is 29.0 mmHg.   Left Atrium: Left atrial size was normal in size.   Right Atrium: Right atrial size was normal in size.   Pericardium: There is no evidence of pericardial effusion.   Mitral Valve: The mitral valve is abnormal. There is mild thickening of  the anterior and posterior mitral valve leaflet(s). Mild mitral annular  calcification. Trivial mitral valve regurgitation.   Tricuspid Valve: The tricuspid valve is grossly normal. Tricuspid valve  regurgitation is trivial.   Aortic Valve: The aortic valve is tricuspid. Aortic valve regurgitation is  not visualized. Aortic valve mean gradient measures 2.0 mmHg. Aortic valve  peak gradient measures 3.8 mmHg. Aortic valve area, by VTI measures 2.21  cm.   Pulmonic Valve: The pulmonic valve was normal in structure. Pulmonic valve  regurgitation is not visualized.   Aorta: The aortic root and ascending aorta are structurally normal, with  no evidence of dilitation.   Venous: The inferior vena cava is normal in size with greater than 50%  respiratory variability, suggesting  right atrial pressure of 3 mmHg.   IAS/Shunts: No atrial level shunt detected by color flow Doppler.   Assessment & Plan   1.  Coronary artery disease-no chest pain today.  Denies recent episodes of arm neck back or chest discomfort.  Occasionally walks her dog. Continue carvedilol, clopidogrel, ASA, rosuvastatin Heart healthy low-sodium high-fiber diet Maintain physical activity  Hyperlipidemia-compliant with rosuvastatin High-fiber diet  Essential hypertension-BP today 126/68. Continue current medical therapy Maintain blood pressure log Low-sodium diet  Palpitations-denies accelerated or irregular heartbeats.  Cardiac event monitor reassuring.  Details above. Maintain p.o. hydration, avoid triggers Continue to monitor  Preoperative cardiac evaluation-left breast radioactive seed localization lumpectomy, Dr. Corliss Skains, [place] [facility], fax #[phone]     Primary Cardiologist: Peter Swaziland, MD  Chart reviewed as part of pre-operative protocol coverage. Given past medical history and time since last visit, based on ACC/AHA guidelines, SHARNE LINDERS would be at acceptable risk for the planned procedure without further cardiovascular testing.   Her aspirin may be held for 5 to 7 days prior to her procedure and her Plavix may be held for 5 days prior to her procedure.  Please resume as soon as hemostasis is achieved.  Her RCRI is a class IV risk, 11% risk of major cardiac event.  She is able to complete greater than 4 METS of physical activity.  Patient was advised that if she develops new symptoms prior to surgery to contact our office to arrange a follow-up appointment.  She verbalized understanding.  I will route this recommendation to the requesting party via Epic fax function and remove from pre-op pool.    Disposition: Follow-up with Dr. Swaziland or me in 12 months.   Thomasene Ripple. Paticia Moster NP-C     07/03/2022, 2:13 PM [facility] [address] Office [phone] Fax [phone]    I spent 14 ***minutes examining this patient, reviewing medications, and using patient centered shared decision making involving her cardiac care.  Prior to her visit I spent greater than 20 minutes reviewing her past medical history,  medications, and prior cardiac tests.

## 2022-07-04 ENCOUNTER — Telehealth: Payer: Self-pay

## 2022-07-04 NOTE — Telephone Encounter (Signed)
  Transition Care Management Follow-up Telephone Call Date of discharge and from where: [facility] 6/4 How have you been since you were released from the hospital? Doing ok Any questions or concerns? No  Items Reviewed: Did the pt receive and understand the discharge instructions provided? Yes  Medications obtained and verified? Yes  Other? No  Any new allergies since your discharge? No  Dietary orders reviewed? No Do you have support at home? Yes    Follow up appointments reviewed:  PCP Hospital f/u appt confirmed? Yes  Scheduled to see 6/12 on  @ . Specialist Hospital f/u appt confirmed? No  Scheduled to see  on  @ . Are transportation arrangements needed? No  If their condition worsens, is the pt aware to call PCP or go to the Emergency Dept.? Yes Was the patient provided with contact information for the PCP's office or ED? Yes Was to pt encouraged to call back with questions or concerns? Yes

## 2022-07-05 NOTE — Pre-Procedure Instructions (Signed)
Surgical Instructions    Your procedure is scheduled on July 13, 2022.  Report to [facility] Main Entrance "A" at 10:00 A.M., then check in with the Admitting office.  Call this number if you have problems the morning of surgery:  [phone]  If you have any questions prior to your surgery date call [phone]: Open Monday-Friday 8am-4pm If you experience any cold or flu symptoms such as cough, fever, chills, shortness of breath, etc. between now and your scheduled surgery, please notify us at the above number.     Remember:  Do not eat after midnight the night before your surgery  You may drink clear liquids until 9:00 AM the morning of your surgery.   Clear liquids allowed are: Water, Non-Citrus Juices (without pulp), Carbonated Beverages, Clear Tea, Black Coffee Only (NO MILK, CREAM OR POWDERED CREAMER of any kind), and Gatorade.     Take these medicines the morning of surgery with A SIP OF WATER:  carvedilol (COREG)   omeprazole (PRILOSEC)    May take these medicines IF NEEDED:  Polyethyl Glycol-Propyl Glycol (LUBRICATING EYE DROPS OP)   propranolol (INDERAL)    Follow your surgeon's instructions on when to stop clopidogrel (PLAVIX).  If no instructions were given by your surgeon then you will need to call the office to get those instructions.     As of today, STOP taking any Aspirin (unless otherwise instructed by your surgeon) Aleve, Naproxen, Ibuprofen, Motrin, Advil, Goody's, BC's, all herbal medications, fish oil, and all vitamins.   WHAT DO I DO ABOUT MY DIABETES MEDICATION?   Do not take metFORMIN (GLUCOPHAGE) the morning of surgery.  STOP taking your empagliflozin (JARDIANCE) three days prior to surgery. Your last dose will be June 15th.      THE NIGHT BEFORE SURGERY, take 33 units of TOUJEO SOLOSTAR insulin.  STOP taking your Semaglutide [facility]) one week prior to surgery. Your last dose will be June 8th.    HOW TO MANAGE YOUR DIABETES BEFORE AND  AFTER SURGERY  Why is it important to control my blood sugar before and after surgery? Improving blood sugar levels before and after surgery helps healing and can limit problems. A way of improving blood sugar control is eating a healthy diet by:  Eating less sugar and carbohydrates  Increasing activity/exercise  Talking with your doctor about reaching your blood sugar goals High blood sugars (greater than 180 mg/dL) can raise your risk of infections and slow your recovery, so you will need to focus on controlling your diabetes during the weeks before surgery. Make sure that the doctor who takes care of your diabetes knows about your planned surgery including the date and location.  How do I manage my blood sugar before surgery? Check your blood sugar at least 4 times a day, starting 2 days before surgery, to make sure that the level is not too high or low.  Check your blood sugar the morning of your surgery when you wake up and every 2 hours until you get to the Short Stay unit.  If your blood sugar is less than 70 mg/dL, you will need to treat for low blood sugar: Do not take insulin. Treat a low blood sugar (less than 70 mg/dL) with  cup of clear juice (cranberry or apple), 4 glucose tablets, OR glucose gel. Recheck blood sugar in 15 minutes after treatment (to make sure it is greater than 70 mg/dL). If your blood sugar is not greater than 70 mg/dL on recheck, call  [phone] for further instructions. Report your blood sugar to the short stay nurse when you get to Short Stay.  If you are admitted to the hospital after surgery: Your blood sugar will be checked by the staff and you will probably be given insulin after surgery (instead of oral diabetes medicines) to make sure you have good blood sugar levels. The goal for blood sugar control after surgery is 80-180 mg/dL.                      Do NOT Smoke (Tobacco/Vaping) for 24 hours prior to your procedure.  If you use a CPAP at  night, you may bring your mask/headgear for your overnight stay.   Contacts, glasses, piercing's, hearing aid's, dentures or partials may not be worn into surgery, please bring cases for these belongings.    For patients admitted to the hospital, discharge time will be determined by your treatment team.   Patients discharged the day of surgery will not be allowed to drive home, and someone needs to stay with them for 24 hours.  SURGICAL WAITING ROOM VISITATION Patients having surgery or a procedure may have no more than 2 support people in the waiting area - these visitors may rotate.   Children under the age of 20 must have an adult with them who is not the patient. If the patient needs to stay at the hospital during part of their recovery, the visitor guidelines for inpatient rooms apply. Pre-op nurse will coordinate an appropriate time for 1 support person to accompany patient in pre-op.  This support person may not rotate.   Please refer to the [facility] website for the visitor guidelines for Inpatients (after your surgery is over and you are in a regular room).    Special instructions:   [facility]- Preparing For Surgery  Before surgery, you can play an important role. Because skin is not sterile, your skin needs to be as free of germs as possible. You can reduce the number of germs on your skin by washing with CHG (chlorahexidine gluconate) Soap before surgery.  CHG is an antiseptic cleaner which kills germs and bonds with the skin to continue killing germs even after washing.    Oral Hygiene is also important to reduce your risk of infection.  Remember - BRUSH YOUR TEETH THE MORNING OF SURGERY WITH YOUR REGULAR TOOTHPASTE  Please do not use if you have an allergy to CHG or antibacterial soaps. If your skin becomes reddened/irritated stop using the CHG.  Do not shave (including legs and underarms) for at least 48 hours prior to first CHG shower. It is OK to shave your face.  Please  follow these instructions carefully.   Shower the NIGHT BEFORE SURGERY and the MORNING OF SURGERY  If you chose to wash your hair, wash your hair first as usual with your normal shampoo.  After you shampoo, rinse your hair and body thoroughly to remove the shampoo.  Use CHG Soap as you would any other liquid soap. You can apply CHG directly to the skin and wash gently with a scrungie or a clean washcloth.   Apply the CHG Soap to your body ONLY FROM THE NECK DOWN.  Do not use on open wounds or open sores. Avoid contact with your eyes, ears, mouth and genitals (private parts). Wash Face and genitals (private parts)  with your normal soap.   Wash thoroughly, paying special attention to the area where your surgery will be performed.  Thoroughly rinse your body with warm water from the neck down.  DO NOT shower/wash with your normal soap after using and rinsing off the CHG Soap.  Pat yourself dry with a CLEAN TOWEL.  Wear CLEAN PAJAMAS to bed the night before surgery  Place CLEAN SHEETS on your bed the night before your surgery  DO NOT SLEEP WITH PETS.   Day of Surgery: Take a shower with CHG soap. Do not wear jewelry or makeup Do not wear lotions, powders, perfumes/colognes, or deodorant. Do not shave 48 hours prior to surgery.  Men may shave face and neck. Do not bring valuables to the hospital.  [facility] is not responsible for any belongings or valuables. Do not wear nail polish, gel polish, artificial nails, or any other type of covering on natural nails (fingers and toes) If you have artificial nails or gel coating that need to be removed by a nail salon, please have this removed prior to surgery. Artificial nails or gel coating may interfere with anesthesia's ability to adequately monitor your vital signs.  Wear Clean/Comfortable clothing the morning of surgery Remember to brush your teeth WITH YOUR REGULAR TOOTHPASTE.   Please read over the following fact sheets that you  were given.    If you received a COVID test during your pre-op visit  it is requested that you wear a mask when out in public, stay away from anyone that may not be feeling well and notify your surgeon if you develop symptoms. If you have been in contact with anyone that has tested positive in the last 10 days please notify you surgeon.

## 2022-07-06 ENCOUNTER — Encounter (HOSPITAL_COMMUNITY)
Admission: RE | Admit: 2022-07-06 | Discharge: 2022-07-06 | Disposition: A | Discharge status: Home / Self Care | Payer: PPO | Source: Ambulatory Visit | Attending: Surgery | Admitting: Surgery

## 2022-07-06 ENCOUNTER — Encounter (HOSPITAL_COMMUNITY): Payer: Self-pay

## 2022-07-06 ENCOUNTER — Other Ambulatory Visit: Payer: Self-pay

## 2022-07-06 ENCOUNTER — Ambulatory Visit: Payer: HMO | Attending: General Practice | Admitting: General Practice

## 2022-07-06 ENCOUNTER — Encounter: Payer: Self-pay | Admitting: General Practice

## 2022-07-06 VITALS — BP 137/60 | HR 79 | Temp 98.1°F | Resp 16 | Ht 65.0 in | Wt 165.6 lb

## 2022-07-06 VITALS — BP 130/78 | HR 81 | Ht 65.5 in | Wt 164.0 lb

## 2022-07-06 DIAGNOSIS — I10 Essential (primary) hypertension: Secondary | ICD-10-CM | POA: Diagnosis not present

## 2022-07-06 DIAGNOSIS — Z794 Long term (current) use of insulin: Secondary | ICD-10-CM | POA: Diagnosis not present

## 2022-07-06 DIAGNOSIS — Z01810 Encounter for preprocedural cardiovascular examination: Secondary | ICD-10-CM

## 2022-07-06 DIAGNOSIS — E785 Hyperlipidemia, unspecified: Secondary | ICD-10-CM | POA: Diagnosis not present

## 2022-07-06 DIAGNOSIS — I25118 Atherosclerotic heart disease of native coronary artery with other forms of angina pectoris: Secondary | ICD-10-CM | POA: Diagnosis not present

## 2022-07-06 DIAGNOSIS — E119 Type 2 diabetes mellitus without complications: Secondary | ICD-10-CM | POA: Insufficient documentation

## 2022-07-06 DIAGNOSIS — E782 Mixed hyperlipidemia: Secondary | ICD-10-CM | POA: Diagnosis not present

## 2022-07-06 DIAGNOSIS — I499 Cardiac arrhythmia, unspecified: Secondary | ICD-10-CM | POA: Diagnosis not present

## 2022-07-06 DIAGNOSIS — I2510 Atherosclerotic heart disease of native coronary artery without angina pectoris: Secondary | ICD-10-CM | POA: Diagnosis not present

## 2022-07-06 DIAGNOSIS — R002 Palpitations: Secondary | ICD-10-CM | POA: Diagnosis not present

## 2022-07-06 DIAGNOSIS — Z01812 Encounter for preprocedural laboratory examination: Secondary | ICD-10-CM | POA: Insufficient documentation

## 2022-07-06 HISTORY — DX: Cardiac arrhythmia, unspecified: I49.9

## 2022-07-06 LAB — HEMOGLOBIN A1C
Hgb A1c MFr Bld: 5.5 % (ref 4.8–5.6)
Mean Plasma Glucose: 111.15 mg/dL

## 2022-07-06 LAB — GLUCOSE, CAPILLARY: Glucose-Capillary: 137 mg/dL — ABNORMAL HIGH (ref 70–99)

## 2022-07-06 NOTE — Progress Notes (Signed)
PCP: Dr. Wylene Simmer Cardiologist: Dr. Swaziland  EKG: 06-27-22 CXR: 06-27-22 ECHO: 04-21-2021 Stress Test: 09-28-2012 Cardiac Cath: 07-17-2006  Plavix and ASA: Hold 5 days  Ozempic LD 6/8  Fasting Blood Sugar- 90-110 Checks Blood Sugar daily A1C pending  Labs: CBC and BMET 06/27/22  Patient denies shortness of breath, fever, cough, and chest pain at PAT appointment.  Patient verbalized understanding of instructions provided today at the PAT appointment.  Patient asked to review instructions at home and day of surgery.   Anesthesia: Pt recently seen in ED for palpitations/SVT. Had cardio follow-up today. Pt reports she has not needed to take any propranolol since newly being prescribed this. Reports that she still has some dizziness, but all providers are aware of this. Does not feel "too bad" today.

## 2022-07-06 NOTE — Patient Instructions (Signed)
Medication Instructions:  The current medical regimen is effective;  continue present plan and medications as directed. Please refer to the Current Medication list given to you today.  *If you need a refill on your cardiac medications before your next appointment, please call your pharmacy*  Lab Work: NONE If you have labs (blood work) drawn today and your tests are completely normal, you will receive your results only by: MyChart Message (if you have MyChart) OR  A paper copy in the mail If you have any lab test that is abnormal or we need to change your treatment, we will call you to review the results.  Testing/Procedures: NONE  Follow-Up: At [facility], you and your health needs are our priority.  As part of our continuing mission to provide you with exceptional heart care, we have created designated Provider Care Teams.  These Care Teams include your primary Cardiologist (physician) and Advanced Practice Providers (APPs -  Physician Assistants and Nurse Practitioners) who all work together to provide you with the care you need, when you need it.  Your next appointment:   6-9 month(s)  Provider:   Peter Swaziland, MD     Other Instructions OK FOR PROCEDURE  Please try to avoid these triggers: Do not use any products that have nicotine or tobacco in them. These include cigarettes, e-cigarettes, and chewing tobacco. If you need help quitting, ask your doctor. Eat heart-healthy foods. Talk with your doctor about the right eating plan for you. Exercise regularly as told by your doctor. Stay hydrated Do not drink alcohol, Caffeine or chocolate. Lose weight if you are overweight. Do not use drugs, including cannabis

## 2022-07-07 NOTE — Anesthesia Preprocedure Evaluation (Addendum)
Anesthesia Evaluation  Patient identified by MRN, date of birth, ID band Patient awake    Reviewed: Allergy & Precautions, H&P , NPO status , Patient's Chart, lab work & pertinent test results  Airway Mallampati: II  TM Distance: >3 FB Neck ROM: Full    Dental no notable dental hx. (+) Teeth Intact, Dental Advisory Given   Pulmonary neg pulmonary ROS   Pulmonary exam normal breath sounds clear to auscultation       Cardiovascular Exercise Tolerance: Good hypertension, Pt. on medications and Pt. on home beta blockers + CAD  negative cardio ROS Normal cardiovascular exam+ dysrhythmias  Rhythm:Regular Rate:Normal     Neuro/Psych negative neurological ROS  negative psych ROS   GI/Hepatic negative GI ROS, Neg liver ROS,GERD  Medicated,,  Endo/Other  negative endocrine ROSdiabetes, Well Controlled    Renal/GU negative Renal ROS  negative genitourinary   Musculoskeletal negative musculoskeletal ROS (+) Arthritis , Osteoarthritis,    Abdominal   Peds negative pediatric ROS (+)  Hematology negative hematology ROS (+)   Anesthesia Other Findings   Reproductive/Obstetrics negative OB ROS                             Anesthesia Physical Anesthesia Plan  ASA: 3  Anesthesia Plan: General   Post-op Pain Management: Minimal or no pain anticipated   Induction: Intravenous  PONV Risk Score and Plan: 3 and Ondansetron and Dexamethasone  Airway Management Planned: LMA  Additional Equipment: None  Intra-op Plan:   Post-operative Plan: Extubation in OR  Informed Consent: I have reviewed the patients History and Physical, chart, labs and discussed the procedure including the risks, benefits and alternatives for the proposed anesthesia with the patient or authorized representative who has indicated his/her understanding and acceptance.       Plan Discussed with: CRNA and  Anesthesiologist  Anesthesia Plan Comments: (PAT note by Antionette Poles, PA-C: Follows with cardiology for history of CAD s/p stenting of her LAD in 2008, palpitations, HTN, HLD.  Nuclear stress test 2014 was abnormal leading to subsequent cath 10/10/2022 showing nonobstructive CAD and widely patent stent.  Echo 03/2021 showed EF 60 to 65%, grade 1 DD, no significant valvular abnormalities.  Event monitor 04/2021 showed predominant normal sinus rhythm 70 bpm, first-degree AV block, 2 runs of SVT, rare PVCs and PACs with a burden of less than 1%. She was seen in the emergency department 06/27/2022 for palpitations.  Her blood pressure was 140/70.  Pulse of 71.  Cardiology was consulted and recommended propranolol 10 mg 4 times daily for SVT as needed.  Vagal maneuvers were reviewed.  She subsequently followed up outpatient with Edd Fabian, NP on 07/06/2022.  No further episodes of palpitations at that time.  Recommended continue current medications.  She was also cleared for surgery at that time.  Per note, "Chart reviewed as part of pre-operative protocol coverage. Given past medical history and time since last visit, based on ACC/AHA guidelines, [name] would be at acceptable risk for the planned procedure without further cardiovascular testing. Her aspirin may be held for 5 to 7 days prior to her procedure and her Plavix may be held for 5 days prior to her procedure.  Please resume as soon as hemostasis is achieved. Her RCRI is a class IV risk, 11% risk of major cardiac event.  She is able to complete greater than 4 METS of physical activity."  Patient reports he was instructed to  hold Plavix 5 days prior to surgery.  IDDM2, A1c 5.5 on 07/06/2022.  She is on once weekly GLP-1 agonist Ozempic, reports last dose 07/02/2022.  BMP and CBC from 06/27/2022 reviewed, unremarkable.  EKG 06/27/2022: Sinus rhythm with first-degree AV block.  Rate 90.  Possible LAE.  Event monitor 04/29/2021: Patient had a min HR  of 52 bpm, max HR of 132 bpm, and avg HR of 70 bpm. Predominant underlying rhythm was Sinus Rhythm. 2 Supraventricular Tachycardia runs occurred, the run with the fastest interval lasting 17 beats with a max rate of 132 bpm (avg 107 bpm); the run with the fastest interval was also the longest. No VT, atrial fibrillation, high degree block, or pauses noted. Isolated atrial and ventricular ectopy was rare (<1%). There were 0 triggered events. No high risk findings.   TTE 04/21/2021: 1. Left ventricular ejection fraction, by estimation, is 60 to 65%. The  left ventricle has normal function. The left ventricle has no regional  wall motion abnormalities. There is mild left ventricular hypertrophy.  Left ventricular diastolic parameters  are consistent with Grade I diastolic dysfunction (impaired relaxation).  2. Right ventricular systolic function is normal. The right ventricular  size is normal. There is normal pulmonary artery systolic pressure.  3. The mitral valve is abnormal. Trivial mitral valve regurgitation.  4. The aortic valve is tricuspid. Aortic valve regurgitation is not  visualized.  5. The inferior vena cava is normal in size with greater than 50%  respiratory variability, suggesting right atrial pressure of 3 mmHg.   Cath 10/09/2012: Final Conclusions:   1. Nonobstructive CAD. The stent in the proximal LAD is widely patent. 2. Normal LV function.   )        Anesthesia Quick Evaluation

## 2022-07-07 NOTE — Progress Notes (Signed)
Anesthesia Chart Review:  Follows with cardiology for history of CAD s/p stenting of her LAD in 2008, palpitations, HTN, HLD.  Nuclear stress test 2014 was abnormal leading to subsequent cath 10/10/2022 showing nonobstructive CAD and widely patent stent.  Echo 03/2021 showed EF 60 to 65%, grade 1 DD, no significant valvular abnormalities.  Event monitor 04/2021 showed predominant normal sinus rhythm 70 bpm, first-degree AV block, 2 runs of SVT, rare PVCs and PACs with a burden of less than 1%. She was seen in the emergency department 06/27/2022 for palpitations.  Her blood pressure was 140/70.  Pulse of 71.  Cardiology was consulted and recommended propranolol 10 mg 4 times daily for SVT as needed.  Vagal maneuvers were reviewed.  She subsequently followed up outpatient with Edd Fabian, NP on 07/06/2022.  No further episodes of palpitations at that time.  Recommended continue current medications.  She was also cleared for surgery at that time.  Per note, "Chart reviewed as part of pre-operative protocol coverage. Given past medical history and time since last visit, based on ACC/AHA guidelines, [name] would be at acceptable risk for the planned procedure without further cardiovascular testing. Her aspirin may be held for 5 to 7 days prior to her procedure and her Plavix may be held for 5 days prior to her procedure.  Please resume as soon as hemostasis is achieved. Her RCRI is a class IV risk, 11% risk of major cardiac event.  She is able to complete greater than 4 METS of physical activity."  Patient reports he was instructed to hold Plavix 5 days prior to surgery.  IDDM2, A1c 5.5 on 07/06/2022.  She is on once weekly GLP-1 agonist Ozempic, reports last dose 07/02/2022.  BMP and CBC from 06/27/2022 reviewed, unremarkable.  EKG 06/27/2022: Sinus rhythm with first-degree AV block.  Rate 90.  Possible LAE.  Event monitor 04/29/2021: Patient had a min HR of 52 bpm, max HR of 132 bpm, and avg HR of 70 bpm.  Predominant underlying rhythm was Sinus Rhythm. 2 Supraventricular Tachycardia runs occurred, the run with the fastest interval lasting 17 beats with a max rate of 132 bpm (avg 107 bpm); the run with the fastest interval was also the longest. No VT, atrial fibrillation, high degree block, or pauses noted. Isolated atrial and ventricular ectopy was rare (<1%). There were 0 triggered events. No high risk findings.   TTE 04/21/2021:  1. Left ventricular ejection fraction, by estimation, is 60 to 65%. The  left ventricle has normal function. The left ventricle has no regional  wall motion abnormalities. There is mild left ventricular hypertrophy.  Left ventricular diastolic parameters  are consistent with Grade I diastolic dysfunction (impaired relaxation).   2. Right ventricular systolic function is normal. The right ventricular  size is normal. There is normal pulmonary artery systolic pressure.   3. The mitral valve is abnormal. Trivial mitral valve regurgitation.   4. The aortic valve is tricuspid. Aortic valve regurgitation is not  visualized.   5. The inferior vena cava is normal in size with greater than 50%  respiratory variability, suggesting right atrial pressure of 3 mmHg.   Cath 10/09/2012: Final Conclusions:   1. Nonobstructive CAD. The stent in the proximal LAD is widely patent. 2. Normal LV function.    Zannie Cove [facility] Short Stay Center/Anesthesiology Phone [phone] 07/07/2022 12:02 PM

## 2022-07-08 ENCOUNTER — Ambulatory Visit: Payer: HMO | Admitting: General Practice

## 2022-07-11 DIAGNOSIS — D249 Benign neoplasm of unspecified breast: Secondary | ICD-10-CM | POA: Diagnosis not present

## 2022-07-11 DIAGNOSIS — R922 Inconclusive mammogram: Secondary | ICD-10-CM | POA: Diagnosis not present

## 2022-07-13 ENCOUNTER — Ambulatory Visit (HOSPITAL_BASED_OUTPATIENT_CLINIC_OR_DEPARTMENT_OTHER): Payer: PPO | Admitting: Anesthesiology

## 2022-07-13 ENCOUNTER — Encounter (HOSPITAL_COMMUNITY): Payer: Self-pay | Admitting: Surgery

## 2022-07-13 ENCOUNTER — Other Ambulatory Visit: Payer: Self-pay

## 2022-07-13 ENCOUNTER — Ambulatory Visit (HOSPITAL_COMMUNITY)
Admission: RE | Admit: 2022-07-13 | Discharge: 2022-07-13 | Disposition: A | Discharge status: Home / Self Care | Payer: PPO | Attending: Surgery | Admitting: Surgery

## 2022-07-13 ENCOUNTER — Ambulatory Visit (HOSPITAL_COMMUNITY): Payer: PPO | Admitting: Physician Assistant

## 2022-07-13 ENCOUNTER — Encounter (HOSPITAL_COMMUNITY)
Admission: RE | Disposition: A | Discharge status: Home / Self Care | Payer: Self-pay | Source: Home / Self Care | Attending: Surgery

## 2022-07-13 DIAGNOSIS — E119 Type 2 diabetes mellitus without complications: Secondary | ICD-10-CM

## 2022-07-13 DIAGNOSIS — D242 Benign neoplasm of left breast: Secondary | ICD-10-CM | POA: Diagnosis not present

## 2022-07-13 DIAGNOSIS — I2510 Atherosclerotic heart disease of native coronary artery without angina pectoris: Secondary | ICD-10-CM

## 2022-07-13 DIAGNOSIS — N6489 Other specified disorders of breast: Secondary | ICD-10-CM | POA: Diagnosis not present

## 2022-07-13 DIAGNOSIS — N62 Hypertrophy of breast: Secondary | ICD-10-CM | POA: Diagnosis not present

## 2022-07-13 DIAGNOSIS — I10 Essential (primary) hypertension: Secondary | ICD-10-CM

## 2022-07-13 DIAGNOSIS — N6012 Diffuse cystic mastopathy of left breast: Secondary | ICD-10-CM | POA: Diagnosis not present

## 2022-07-13 HISTORY — PX: BREAST LUMPECTOMY WITH RADIOACTIVE SEED LOCALIZATION: SHX6424

## 2022-07-13 LAB — GLUCOSE, CAPILLARY
Glucose-Capillary: 112 mg/dL — ABNORMAL HIGH (ref 70–99)
Glucose-Capillary: 99 mg/dL (ref 70–99)

## 2022-07-13 SURGERY — BREAST LUMPECTOMY WITH RADIOACTIVE SEED LOCALIZATION
Anesthesia: General | Site: Breast | Laterality: Left

## 2022-07-13 MED ORDER — DEXAMETHASONE SODIUM PHOSPHATE 10 MG/ML IJ SOLN
INTRAMUSCULAR | Status: DC | PRN
  Administered 2022-07-13: 5 mg via INTRAVENOUS

## 2022-07-13 MED ORDER — PROPOFOL 10 MG/ML IV BOLUS
INTRAVENOUS | Status: DC | PRN
  Administered 2022-07-13: 170 mg via INTRAVENOUS
  Administered 2022-07-13: 20 mg via INTRAVENOUS

## 2022-07-13 MED ORDER — CHLORHEXIDINE GLUCONATE 0.12 % MT SOLN: 15.0000 mL | Freq: Once | OROMUCOSAL | Status: AC

## 2022-07-13 MED ORDER — CEFAZOLIN SODIUM-DEXTROSE 2-4 GM/100ML-% IV SOLN
2.0000 g | INTRAVENOUS | Status: AC
  Administered 2022-07-13: 2 g via INTRAVENOUS

## 2022-07-13 MED ORDER — MEPERIDINE HCL 25 MG/ML IJ SOLN: 6.2500 mg | INTRAMUSCULAR | Status: DC | PRN

## 2022-07-13 MED ORDER — ONDANSETRON HCL 4 MG/2ML IJ SOLN
INTRAMUSCULAR | Status: DC | PRN
  Administered 2022-07-13: 4 mg via INTRAVENOUS

## 2022-07-13 MED ORDER — OXYCODONE HCL 5 MG/5ML PO SOLN: 5.0000 mg | Freq: Once | ORAL | Status: DC | PRN

## 2022-07-13 MED ORDER — FENTANYL CITRATE (PF) 100 MCG/2ML IJ SOLN: 25.0000 ug | INTRAMUSCULAR | Status: DC | PRN

## 2022-07-13 MED ORDER — LIDOCAINE 2% (20 MG/ML) 5 ML SYRINGE
INTRAMUSCULAR | Status: DC | PRN
  Administered 2022-07-13: 60 mg via INTRAVENOUS

## 2022-07-13 MED ORDER — ACETAMINOPHEN 325 MG PO TABS: 325.0000 mg | ORAL_TABLET | ORAL | Status: DC | PRN

## 2022-07-13 MED ORDER — PHENYLEPHRINE 80 MCG/ML (10ML) SYRINGE FOR IV PUSH (FOR BLOOD PRESSURE SUPPORT)
PREFILLED_SYRINGE | INTRAVENOUS | Status: DC | PRN
  Administered 2022-07-13 (×2): 160 ug via INTRAVENOUS

## 2022-07-13 MED ORDER — ACETAMINOPHEN 500 MG PO TABS
ORAL_TABLET | ORAL | Status: AC
  Administered 2022-07-13: 1000 mg via ORAL
  Filled 2022-07-13: qty 2

## 2022-07-13 MED ORDER — CHLORHEXIDINE GLUCONATE CLOTH 2 % EX PADS: 6.0000 | MEDICATED_PAD | Freq: Once | CUTANEOUS | Status: DC

## 2022-07-13 MED ORDER — PROPOFOL 10 MG/ML IV BOLUS
INTRAVENOUS | Status: AC
  Filled 2022-07-13: qty 20

## 2022-07-13 MED ORDER — ORAL CARE MOUTH RINSE: 15.0000 mL | Freq: Once | OROMUCOSAL | Status: AC

## 2022-07-13 MED ORDER — LACTATED RINGERS IV SOLN: INTRAVENOUS | Status: DC

## 2022-07-13 MED ORDER — 0.9 % SODIUM CHLORIDE (POUR BTL) OPTIME
TOPICAL | Status: DC | PRN
  Administered 2022-07-13: 1000 mL

## 2022-07-13 MED ORDER — CEFAZOLIN SODIUM-DEXTROSE 2-4 GM/100ML-% IV SOLN
INTRAVENOUS | Status: AC
  Filled 2022-07-13: qty 100

## 2022-07-13 MED ORDER — ONDANSETRON HCL 4 MG/2ML IJ SOLN
INTRAMUSCULAR | Status: AC
  Filled 2022-07-13: qty 2

## 2022-07-13 MED ORDER — ACETAMINOPHEN 160 MG/5ML PO SOLN: 325.0000 mg | ORAL | Status: DC | PRN

## 2022-07-13 MED ORDER — ONDANSETRON HCL 4 MG/2ML IJ SOLN: 4.0000 mg | Freq: Once | INTRAMUSCULAR | Status: DC | PRN

## 2022-07-13 MED ORDER — OXYCODONE HCL 5 MG PO TABS: 5.0000 mg | ORAL_TABLET | Freq: Once | ORAL | Status: DC | PRN

## 2022-07-13 MED ORDER — BUPIVACAINE-EPINEPHRINE (PF) 0.25% -1:200000 IJ SOLN
INTRAMUSCULAR | Status: AC
  Filled 2022-07-13: qty 30

## 2022-07-13 MED ORDER — FENTANYL CITRATE (PF) 250 MCG/5ML IJ SOLN
INTRAMUSCULAR | Status: DC | PRN
  Administered 2022-07-13: 50 ug via INTRAVENOUS

## 2022-07-13 MED ORDER — FENTANYL CITRATE (PF) 250 MCG/5ML IJ SOLN
INTRAMUSCULAR | Status: AC
  Filled 2022-07-13: qty 5

## 2022-07-13 MED ORDER — DEXAMETHASONE SODIUM PHOSPHATE 10 MG/ML IJ SOLN
INTRAMUSCULAR | Status: AC
  Filled 2022-07-13: qty 1

## 2022-07-13 MED ORDER — CHLORHEXIDINE GLUCONATE 0.12 % MT SOLN
OROMUCOSAL | Status: AC
  Administered 2022-07-13: 15 mL via OROMUCOSAL
  Filled 2022-07-13: qty 15

## 2022-07-13 MED ORDER — ACETAMINOPHEN 500 MG PO TABS: 1000.0000 mg | ORAL_TABLET | ORAL | Status: AC

## 2022-07-13 MED ORDER — BUPIVACAINE-EPINEPHRINE 0.25% -1:200000 IJ SOLN
INTRAMUSCULAR | Status: DC | PRN
  Administered 2022-07-13: 10 mL

## 2022-07-13 SURGICAL SUPPLY — 44 items
APL PRP STRL LF DISP 70% ISPRP (MISCELLANEOUS) ×1
APL SKNCLS STERI-STRIP NONHPOA (GAUZE/BANDAGES/DRESSINGS) ×1
APPLIER CLIP 9.375 MED OPEN (MISCELLANEOUS)
APR CLP MED 9.3 20 MLT OPN (MISCELLANEOUS)
BAG COUNTER SPONGE SURGICOUNT (BAG) ×1 IMPLANT
BAG SPNG CNTER NS LX DISP (BAG) ×1
BENZOIN TINCTURE PRP APPL 2/3 (GAUZE/BANDAGES/DRESSINGS) ×1 IMPLANT
BINDER BREAST LRG (GAUZE/BANDAGES/DRESSINGS) IMPLANT
BINDER BREAST XLRG (GAUZE/BANDAGES/DRESSINGS) IMPLANT
BINDER BREAST XXLRG (GAUZE/BANDAGES/DRESSINGS) IMPLANT
CANISTER SUCT 3000ML PPV (MISCELLANEOUS) ×1 IMPLANT
CHLORAPREP W/TINT 26 (MISCELLANEOUS) ×1 IMPLANT
CLIP APPLIE 9.375 MED OPEN (MISCELLANEOUS) IMPLANT
COVER PROBE W GEL 5X96 (DRAPES) ×1 IMPLANT
COVER SURGICAL LIGHT HANDLE (MISCELLANEOUS) ×1 IMPLANT
DEVICE DUBIN SPECIMEN MAMMOGRA (MISCELLANEOUS) ×1 IMPLANT
DRAPE CHEST BREAST 15X10 FENES (DRAPES) ×1 IMPLANT
DRSG TEGADERM 4X4.75 (GAUZE/BANDAGES/DRESSINGS) ×1 IMPLANT
ELECT CAUTERY BLADE 6.4 (BLADE) ×1 IMPLANT
ELECT REM PT RETURN 9FT ADLT (ELECTROSURGICAL) ×1
ELECTRODE REM PT RTRN 9FT ADLT (ELECTROSURGICAL) ×1 IMPLANT
GAUZE SPONGE 2X2 8PLY STRL LF (GAUZE/BANDAGES/DRESSINGS) ×1 IMPLANT
GAUZE SPONGE 4X4 12PLY STRL (GAUZE/BANDAGES/DRESSINGS) IMPLANT
GLOVE BIO SURGEON STRL SZ7 (GLOVE) ×1 IMPLANT
GLOVE BIOGEL PI IND STRL 7.5 (GLOVE) ×1 IMPLANT
GOWN STRL REUS W/ TWL LRG LVL3 (GOWN DISPOSABLE) ×2 IMPLANT
GOWN STRL REUS W/TWL LRG LVL3 (GOWN DISPOSABLE) ×2
ILLUMINATOR WAVEGUIDE N/F (MISCELLANEOUS) IMPLANT
KIT BASIN OR (CUSTOM PROCEDURE TRAY) ×1 IMPLANT
KIT MARKER MARGIN INK (KITS) ×1 IMPLANT
LIGHT WAVEGUIDE WIDE FLAT (MISCELLANEOUS) IMPLANT
NDL HYPO 25GX1X1/2 BEV (NEEDLE) ×1 IMPLANT
NEEDLE HYPO 25GX1X1/2 BEV (NEEDLE) ×1 IMPLANT
NS IRRIG 1000ML POUR BTL (IV SOLUTION) ×1 IMPLANT
PACK GENERAL/GYN (CUSTOM PROCEDURE TRAY) ×1 IMPLANT
SPONGE T-LAP 4X18 ~~LOC~~+RFID (SPONGE) ×1 IMPLANT
STRIP CLOSURE SKIN 1/2X4 (GAUZE/BANDAGES/DRESSINGS) ×1 IMPLANT
SUT MNCRL AB 4-0 PS2 18 (SUTURE) ×1 IMPLANT
SUT SILK 2 0 SH (SUTURE) IMPLANT
SUT VIC AB 3-0 SH 27 (SUTURE) ×1
SUT VIC AB 3-0 SH 27X BRD (SUTURE) ×1 IMPLANT
SYR CONTROL 10ML LL (SYRINGE) ×1 IMPLANT
TOWEL GREEN STERILE (TOWEL DISPOSABLE) ×1 IMPLANT
TOWEL GREEN STERILE FF (TOWEL DISPOSABLE) ×1 IMPLANT

## 2022-07-13 NOTE — Discharge Instructions (Signed)
[facility] Office Phone Number [phone]  BREAST BIOPSY/ PARTIAL MASTECTOMY: POST OP INSTRUCTIONS  Always review your discharge instruction sheet given to you by the facility where your surgery was performed.  IF YOU HAVE DISABILITY OR FAMILY LEAVE FORMS, YOU MUST BRING THEM TO THE OFFICE FOR PROCESSING.  DO NOT GIVE THEM TO YOUR DOCTOR.  A prescription for pain medication may be given to you upon discharge.  Take your pain medication as prescribed, if needed.  If narcotic pain medicine is not needed, then you may take acetaminophen (Tylenol) or ibuprofen (Advil) as needed. Take your usually prescribed medications unless otherwise directed If you need a refill on your pain medication, please contact your pharmacy.  They will contact our office to request authorization.  Prescriptions will not be filled after 5pm or on week-ends. You should eat very light the first 24 hours after surgery, such as soup, crackers, pudding, etc.  Resume your normal diet the day after surgery. Most patients will experience some swelling and bruising in the breast.  Ice packs and a good support bra will help.  Swelling and bruising can take several days to resolve.  It is common to experience some constipation if taking pain medication after surgery.  Increasing fluid intake and taking a stool softener will usually help or prevent this problem from occurring.  A mild laxative (Milk of Magnesia or Miralax) should be taken according to package directions if there are no bowel movements after 48 hours. Unless discharge instructions indicate otherwise, you may remove your bandages 24-48 hours after surgery, and you may shower at that time.  You may have steri-strips (small skin tapes) in place directly over the incision.  These strips should be left on the skin for 7-10 days.  If your surgeon used skin glue on the incision, you may shower in 24 hours.  The glue will flake off over the next 2-3 weeks.  Any  sutures or staples will be removed at the office during your follow-up visit. ACTIVITIES:  You may resume regular daily activities (gradually increasing) beginning the next day.  Wearing a good support bra or sports bra minimizes pain and swelling.  You may have sexual intercourse when it is comfortable. You may drive when you no longer are taking prescription pain medication, you can comfortably wear a seatbelt, and you can safely maneuver your car and apply brakes. RETURN TO WORK:  ______________________________________________________________________________________ You should see your doctor in the office for a follow-up appointment approximately two weeks after your surgery.  Your doctor's nurse will typically make your follow-up appointment when she calls you with your pathology report.  Expect your pathology report 2-3 business days after your surgery.  You may call to check if you do not hear from us after three days. OTHER INSTRUCTIONS: _______________________________________________________________________________________________ _____________________________________________________________________________________________________________________________________ _____________________________________________________________________________________________________________________________________ _____________________________________________________________________________________________________________________________________  WHEN TO CALL YOUR DOCTOR: Fever over 101.0 Nausea and/or vomiting. Extreme swelling or bruising. Continued bleeding from incision. Increased pain, redness, or drainage from the incision.  The clinic staff is available to answer your questions during regular business hours.  Please don't hesitate to call and ask to speak to one of the nurses for clinical concerns.  If you have a medical emergency, go to the nearest emergency room or call 911.  A surgeon from [facility] is always on call at the hospital.  For further questions, please visit [email or web address]   

## 2022-07-13 NOTE — Anesthesia Postprocedure Evaluation (Signed)
Anesthesia Post Note  Patient: [name]  Procedure(s) Performed: LEFT BREAST LUMPECTOMY WITH RADIOACTIVE SEED LOCALIZATION (Left: Breast)     Patient location during evaluation: PACU Anesthesia Type: General Level of consciousness: awake and alert Pain management: pain level controlled Vital Signs Assessment: post-procedure vital signs reviewed and stable Respiratory status: spontaneous breathing, nonlabored ventilation, respiratory function stable and patient connected to nasal cannula oxygen Cardiovascular status: blood pressure returned to baseline and stable Postop Assessment: no apparent nausea or vomiting Anesthetic complications: no   No notable events documented.  Last Vitals:  Vitals:   07/13/22 0959 07/13/22 1218  BP: 130/73 (!) 126/59  Pulse: 75 67  Resp: 17 16  Temp: (!) 36.4 C 36.8 C  SpO2: 95% 95%    Last Pain:  Vitals:   07/13/22 1218  PainSc: 0-No pain                 Jeneane Pieczynski

## 2022-07-13 NOTE — Transfer of Care (Signed)
Immediate Anesthesia Transfer of Care Note  Patient: [name]  Procedure(s) Performed: LEFT BREAST LUMPECTOMY WITH RADIOACTIVE SEED LOCALIZATION (Left: Breast)  Patient Location: PACU  Anesthesia Type:General  Level of Consciousness: awake, drowsy, and patient cooperative  Airway & Oxygen Therapy: Patient Spontanous Breathing and Patient connected to face mask oxygen  Post-op Assessment: Report given to RN, Post -op Vital signs reviewed and stable, and Patient moving all extremities X 4  Post vital signs: Reviewed and stable  Last Vitals:  Vitals Value Taken Time  BP 126/59 07/13/22 1218  Temp    Pulse 68 07/13/22 1219  Resp    SpO2 92 % 07/13/22 1219  Vitals shown include unvalidated device data.  Last Pain:  Vitals:   07/13/22 1008  PainSc: 0-No pain      Patients Stated Pain Goal: 3 (07/13/22 1008)  Complications: No notable events documented.

## 2022-07-13 NOTE — Op Note (Signed)
Pre-op Diagnosis:  Left breast intraductal papililoma Post-op Diagnosis: same Procedure:  Left breast radioactive seed localized lumpectomy Surgeon:  Deina Lipsey K. Anesthesia:  GEN - LMA Indications:  This is an 82 year old female with a history of bilateral retropectoral saline implants who recently developed spontaneous left nipple discharge that occasionally appears bloody. She underwent workup including mammogram and ultrasound. She has a 1.0 x 0.3 cm circumscribed mass in the left breast at 12:00 2 cm from the nipple. She underwent biopsy of this that revealed a intraductal papilloma. No other suspicious findings.  Description of procedure: The patient is brought to the operating room placed in supine position on the operating room table. After an adequate level of general anesthesia was obtained, her left breast was prepped with ChloraPrep and draped in sterile fashion. A timeout was taken to ensure the proper patient and proper procedure. We interrogated the breast with the neoprobe. We made a circumareolar incision around the upper side of the nipple after infiltrating with 0.25% Marcaine. Dissection was carried down in the breast tissue with cautery. We used the neoprobe to guide [place] towards the radioactive seed. We excised an area of tissue around the radioactive seed 2 cm in diameter. The specimen was removed and was oriented with a paint kit. Specimen mammogram showed the radioactive seed as well as the biopsy clip within the specimen. This was sent for pathologic examination. There is no residual radioactivity within the biopsy cavity. We inspected carefully for hemostasis. The wound was thoroughly irrigated. The wound was closed with a deep layer of 3-0 Vicryl and a subcuticular layer of 4-0 Monocryl. Benzoin Steri-Strips were applied. The patient was then extubated and brought to the recovery room in stable condition. All sponge, instrument, and needle counts are correct.  Wilmon Arms. Corliss Skains,  MD, [facility]  General/ Trauma Surgery  07/13/2022 12:12 PM

## 2022-07-13 NOTE — Interval H&P Note (Signed)
History and Physical Interval Note:  07/13/2022 10:12 AM  [name]  has presented today for surgery, with the diagnosis of LEFT BREAST INTRADUCTAL PAPILLOMA.  The various methods of treatment have been discussed with the patient and family. After consideration of risks, benefits and other options for treatment, the patient has consented to  Procedure(s): LEFT BREAST LUMPECTOMY WITH RADIOACTIVE SEED LOCALIZATION (Left) as a surgical intervention.  The patient's history has been reviewed, patient examined, no change in status, stable for surgery.  I have reviewed the patient's chart and labs.  Questions were answered to the patient's satisfaction.     Wynona Luna

## 2022-07-13 NOTE — Anesthesia Procedure Notes (Signed)
Procedure Name: LMA Insertion Date/Time: 07/13/2022 11:37 AM  Performed by: Aundria Rud, CRNAPre-anesthesia Checklist: Patient identified, Patient being monitored, Timeout performed, Emergency Drugs available and Suction available Patient Re-evaluated:Patient Re-evaluated prior to induction Oxygen Delivery Method: Circle system utilized Preoxygenation: Pre-oxygenation with 100% oxygen Induction Type: IV induction Ventilation: Mask ventilation without difficulty LMA: LMA inserted LMA Size: 3.0 Tube type: Oral Number of attempts: 1 Placement Confirmation: positive ETCO2 and breath sounds checked- equal and bilateral Tube secured with: Tape Dental Injury: Teeth and Oropharynx as per pre-operative assessment

## 2022-07-14 ENCOUNTER — Encounter (HOSPITAL_COMMUNITY): Payer: Self-pay | Admitting: Surgery

## 2022-07-15 LAB — SURGICAL PATHOLOGY

## 2022-07-18 DIAGNOSIS — M5030 Other cervical disc degeneration, unspecified cervical region: Secondary | ICD-10-CM | POA: Diagnosis not present

## 2022-07-18 DIAGNOSIS — M25511 Pain in right shoulder: Secondary | ICD-10-CM | POA: Diagnosis not present

## 2022-08-01 DIAGNOSIS — M25511 Pain in right shoulder: Secondary | ICD-10-CM | POA: Diagnosis not present

## 2022-08-08 DIAGNOSIS — M5030 Other cervical disc degeneration, unspecified cervical region: Secondary | ICD-10-CM | POA: Diagnosis not present

## 2022-08-08 DIAGNOSIS — M25511 Pain in right shoulder: Secondary | ICD-10-CM | POA: Diagnosis not present

## 2022-08-15 ENCOUNTER — Telehealth: Payer: Self-pay | Admitting: Cardiology

## 2022-08-15 NOTE — Telephone Encounter (Signed)
STAT if HR is under 50 or over 120 (normal HR is 60-100 beats per minute)  What is your heart rate? 77   Do you have a log of your heart rate readings (document readings)? 183 - Sunday morning around 3:30am   Do you have any other symptoms?  Headache

## 2022-08-15 NOTE — Telephone Encounter (Signed)
Patient states she was seen in ED last month for heart rate was at 187 and she went to ED and followed up with [name]. Since then she has been fine until Sunday morning she woke up and stated she felt funny her heart rate was 184. Currently asymptomatic. Advised to stay hydrated and monitor heart rate. HR currently 75. Will forward to provider

## 2022-08-16 NOTE — Telephone Encounter (Signed)
Patient aware of provider recommendations. She verbalized understanding

## 2022-08-17 DIAGNOSIS — M48062 Spinal stenosis, lumbar region with neurogenic claudication: Secondary | ICD-10-CM | POA: Diagnosis not present

## 2022-08-17 DIAGNOSIS — M5030 Other cervical disc degeneration, unspecified cervical region: Secondary | ICD-10-CM | POA: Diagnosis not present

## 2022-08-19 ENCOUNTER — Telehealth: Payer: Self-pay

## 2022-08-19 NOTE — Telephone Encounter (Signed)
Pts last OV was 07/06/22.     Pre-operative Risk Assessment    Patient Name: [name]  DOB: Jun 18, 1940 MRN: [id number]      Request for Surgical Clearance    Procedure:   Cervical Selective Nerve Root Block  Date of Surgery:  Clearance 08/30/22                                 Surgeon:  Dr. Ethelene Hal Surgeon's Group or Practice Name:  [facility] Phone number:  [phone] Fax number:  [phone]   Type of Clearance Requested:   - Medical  - Pharmacy:  Hold Clopidogrel (Plavix) requesting office is asking for 7 day hold   Type of Anesthesia:  Not Indicated   Additional requests/questions:    Wynetta Fines   08/19/2022, 2:11 PM

## 2022-08-19 NOTE — Telephone Encounter (Signed)
   Primary Cardiologist: Peter Swaziland, MD  Chart reviewed as part of pre-operative protocol coverage. Given past medical history and time since last visit, based on ACC/AHA guidelines, [name] would be at acceptable risk for the planned procedure without further cardiovascular testing.   Patient was advised that if she develops new symptoms prior to surgery to contact our office to arrange a follow-up appointment.  He verbalized understanding.  Per office protocol, he may hold Plavix for 5-7 days prior to procedure and should resume as soon as hemodynamically stable postoperatively. Appropriate hold for Plavix is 5 days, however a 7 day hold is permissible.   I will route this recommendation to the requesting party via Epic fax function and remove from pre-op pool.  Please call with questions.  Levi Aland, NP-C  08/19/2022, 2:55 PM [address] Office [phone] Fax [phone]

## 2022-08-30 DIAGNOSIS — M5412 Radiculopathy, cervical region: Secondary | ICD-10-CM | POA: Diagnosis not present

## 2022-09-01 DIAGNOSIS — M75121 Complete rotator cuff tear or rupture of right shoulder, not specified as traumatic: Secondary | ICD-10-CM | POA: Diagnosis not present

## 2022-09-02 ENCOUNTER — Telehealth: Payer: Self-pay | Admitting: *Deleted

## 2022-09-02 NOTE — Telephone Encounter (Signed)
   Pre-operative Risk Assessment    Patient Name: [name]  DOB: 06/02/1940 MRN: [id number]      Request for Surgical Clearance    Procedure:   RIGHT SHOULDER SCOPE SAD, MINI OPEN RCR  Date of Surgery:  Clearance TBD                                 Surgeon:  DR. Malon Kindle Surgeon's Group or Practice Name:  [facility] Phone number:  [phone] ATTN: KERRI MAZE Fax number:  [phone]   Type of Clearance Requested:   - Medical ; PLAVIX   Type of Anesthesia:   CHOICE WITH INTERSCALENE BLOCK   Additional requests/questions:    Elpidio Anis   09/02/2022, 10:48 AM

## 2022-09-25 ENCOUNTER — Other Ambulatory Visit: Payer: Self-pay | Admitting: Cardiology

## 2022-09-28 DIAGNOSIS — M48062 Spinal stenosis, lumbar region with neurogenic claudication: Secondary | ICD-10-CM | POA: Diagnosis not present

## 2022-09-28 DIAGNOSIS — M545 Low back pain, unspecified: Secondary | ICD-10-CM | POA: Diagnosis not present

## 2022-09-28 DIAGNOSIS — Z79899 Other long term (current) drug therapy: Secondary | ICD-10-CM | POA: Diagnosis not present

## 2022-09-28 DIAGNOSIS — Z5181 Encounter for therapeutic drug level monitoring: Secondary | ICD-10-CM | POA: Diagnosis not present

## 2022-09-28 DIAGNOSIS — M5030 Other cervical disc degeneration, unspecified cervical region: Secondary | ICD-10-CM | POA: Diagnosis not present

## 2022-09-29 ENCOUNTER — Telehealth: Payer: Self-pay | Admitting: Cardiology

## 2022-09-29 DIAGNOSIS — I499 Cardiac arrhythmia, unspecified: Secondary | ICD-10-CM

## 2022-09-29 MED ORDER — CARVEDILOL 12.5 MG PO TABS
12.5000 mg | ORAL_TABLET | Freq: Two times a day (BID) | ORAL | 3 refills | Status: DC
Start: 2022-09-29 — End: 2023-09-18

## 2022-09-29 NOTE — Telephone Encounter (Signed)
Pt c/o medication issue:  1. Name of Medication: carvedilol (COREG) 6.25 MG tablet   2. How are you currently taking this medication (dosage and times per day)? Take 1 tablet (6.25 mg total) by mouth 2 (two) times daily.   3. Are you having a reaction (difficulty breathing--STAT)? No  4. What is your medication issue? Patient is calling because she is currently out of the medication. Patient stated at her last visit, they had spoken about increasing the dosage of this medication. Patient was wanting a new prescription with the increased dosage amount sent to the [facility] on [address] with a 90 Day Supply. Please advise.

## 2022-09-29 NOTE — Telephone Encounter (Signed)
Carvedilol 12.5 mg sent to [facility] per patient request.   Patient is aware

## 2022-10-05 NOTE — Telephone Encounter (Signed)
     Primary Cardiologist: Peter Swaziland, MD  Chart reviewed as part of pre-operative protocol coverage. Given past medical history and time since last visit, based on ACC/AHA guidelines, LANET VANDEHEY would be at acceptable risk for the planned procedure without further cardiovascular testing.   Her RCRI is a class IV risk, 11% risk of major cardiac event. She is able to complete greater than 4 METS of physical activity.   Per office protocol, he may hold Plavix for 5-7 days prior to procedure and should resume as soon as hemodynamically stable postoperatively. Appropriate hold for Plavix is 5 days, however a 7 day hold is permissible.   I will route this recommendation to the requesting party via Epic fax function and remove from pre-op pool.  Please call with questions.  Thomasene Ripple. Zoran Yankee NP-C     10/05/2022, 12:10 PM [facility] [address] Office [phone] Fax [phone]

## 2022-10-24 DIAGNOSIS — S43431A Superior glenoid labrum lesion of right shoulder, initial encounter: Secondary | ICD-10-CM | POA: Diagnosis not present

## 2022-10-24 DIAGNOSIS — M7551 Bursitis of right shoulder: Secondary | ICD-10-CM | POA: Diagnosis not present

## 2022-10-24 DIAGNOSIS — M75111 Incomplete rotator cuff tear or rupture of right shoulder, not specified as traumatic: Secondary | ICD-10-CM | POA: Diagnosis not present

## 2022-10-24 DIAGNOSIS — Y999 Unspecified external cause status: Secondary | ICD-10-CM | POA: Diagnosis not present

## 2022-10-24 DIAGNOSIS — G8918 Other acute postprocedural pain: Secondary | ICD-10-CM | POA: Diagnosis not present

## 2022-10-24 DIAGNOSIS — S46011A Strain of muscle(s) and tendon(s) of the rotator cuff of right shoulder, initial encounter: Secondary | ICD-10-CM | POA: Diagnosis not present

## 2022-10-24 DIAGNOSIS — X58XXXA Exposure to other specified factors, initial encounter: Secondary | ICD-10-CM | POA: Diagnosis not present

## 2022-10-24 DIAGNOSIS — M19011 Primary osteoarthritis, right shoulder: Secondary | ICD-10-CM | POA: Diagnosis not present

## 2022-10-27 DIAGNOSIS — M25511 Pain in right shoulder: Secondary | ICD-10-CM | POA: Diagnosis not present

## 2022-11-01 DIAGNOSIS — M25511 Pain in right shoulder: Secondary | ICD-10-CM | POA: Diagnosis not present

## 2022-11-03 DIAGNOSIS — M25511 Pain in right shoulder: Secondary | ICD-10-CM | POA: Diagnosis not present

## 2022-11-08 DIAGNOSIS — M25511 Pain in right shoulder: Secondary | ICD-10-CM | POA: Diagnosis not present

## 2022-11-10 DIAGNOSIS — M25511 Pain in right shoulder: Secondary | ICD-10-CM | POA: Diagnosis not present

## 2022-11-11 DIAGNOSIS — E1151 Type 2 diabetes mellitus with diabetic peripheral angiopathy without gangrene: Secondary | ICD-10-CM | POA: Diagnosis not present

## 2022-11-11 DIAGNOSIS — Z1389 Encounter for screening for other disorder: Secondary | ICD-10-CM | POA: Diagnosis not present

## 2022-11-15 DIAGNOSIS — M25511 Pain in right shoulder: Secondary | ICD-10-CM | POA: Diagnosis not present

## 2022-11-17 DIAGNOSIS — M25511 Pain in right shoulder: Secondary | ICD-10-CM | POA: Diagnosis not present

## 2022-11-18 DIAGNOSIS — E1165 Type 2 diabetes mellitus with hyperglycemia: Secondary | ICD-10-CM | POA: Diagnosis not present

## 2022-11-18 DIAGNOSIS — E1159 Type 2 diabetes mellitus with other circulatory complications: Secondary | ICD-10-CM | POA: Diagnosis not present

## 2022-11-18 DIAGNOSIS — R82998 Other abnormal findings in urine: Secondary | ICD-10-CM | POA: Diagnosis not present

## 2022-11-18 DIAGNOSIS — Z794 Long term (current) use of insulin: Secondary | ICD-10-CM | POA: Diagnosis not present

## 2022-11-18 DIAGNOSIS — I471 Supraventricular tachycardia, unspecified: Secondary | ICD-10-CM | POA: Diagnosis not present

## 2022-11-18 DIAGNOSIS — M5412 Radiculopathy, cervical region: Secondary | ICD-10-CM | POA: Diagnosis not present

## 2022-11-18 DIAGNOSIS — Z1331 Encounter for screening for depression: Secondary | ICD-10-CM | POA: Diagnosis not present

## 2022-11-18 DIAGNOSIS — E1151 Type 2 diabetes mellitus with diabetic peripheral angiopathy without gangrene: Secondary | ICD-10-CM | POA: Diagnosis not present

## 2022-11-18 DIAGNOSIS — Z0000 Encounter for general adult medical examination without abnormal findings: Secondary | ICD-10-CM | POA: Diagnosis not present

## 2022-11-18 DIAGNOSIS — I2510 Atherosclerotic heart disease of native coronary artery without angina pectoris: Secondary | ICD-10-CM | POA: Diagnosis not present

## 2022-11-18 DIAGNOSIS — D692 Other nonthrombocytopenic purpura: Secondary | ICD-10-CM | POA: Diagnosis not present

## 2022-11-18 DIAGNOSIS — Z23 Encounter for immunization: Secondary | ICD-10-CM | POA: Diagnosis not present

## 2022-11-18 DIAGNOSIS — Z1339 Encounter for screening examination for other mental health and behavioral disorders: Secondary | ICD-10-CM | POA: Diagnosis not present

## 2022-11-18 DIAGNOSIS — I119 Hypertensive heart disease without heart failure: Secondary | ICD-10-CM | POA: Diagnosis not present

## 2022-11-18 DIAGNOSIS — Z79890 Hormone replacement therapy (postmenopausal): Secondary | ICD-10-CM | POA: Diagnosis not present

## 2022-11-18 DIAGNOSIS — E663 Overweight: Secondary | ICD-10-CM | POA: Diagnosis not present

## 2022-11-22 DIAGNOSIS — M25511 Pain in right shoulder: Secondary | ICD-10-CM | POA: Diagnosis not present

## 2022-11-22 DIAGNOSIS — H524 Presbyopia: Secondary | ICD-10-CM | POA: Diagnosis not present

## 2022-11-22 DIAGNOSIS — H04123 Dry eye syndrome of bilateral lacrimal glands: Secondary | ICD-10-CM | POA: Diagnosis not present

## 2022-11-22 DIAGNOSIS — E113291 Type 2 diabetes mellitus with mild nonproliferative diabetic retinopathy without macular edema, right eye: Secondary | ICD-10-CM | POA: Diagnosis not present

## 2022-11-22 DIAGNOSIS — H52203 Unspecified astigmatism, bilateral: Secondary | ICD-10-CM | POA: Diagnosis not present

## 2022-11-22 DIAGNOSIS — H353131 Nonexudative age-related macular degeneration, bilateral, early dry stage: Secondary | ICD-10-CM | POA: Diagnosis not present

## 2022-11-22 DIAGNOSIS — H26492 Other secondary cataract, left eye: Secondary | ICD-10-CM | POA: Diagnosis not present

## 2022-11-25 DIAGNOSIS — M25511 Pain in right shoulder: Secondary | ICD-10-CM | POA: Diagnosis not present

## 2022-11-28 DIAGNOSIS — M25511 Pain in right shoulder: Secondary | ICD-10-CM | POA: Diagnosis not present

## 2022-12-06 DIAGNOSIS — M48062 Spinal stenosis, lumbar region with neurogenic claudication: Secondary | ICD-10-CM | POA: Diagnosis not present

## 2022-12-06 DIAGNOSIS — M25511 Pain in right shoulder: Secondary | ICD-10-CM | POA: Diagnosis not present

## 2022-12-06 DIAGNOSIS — M542 Cervicalgia: Secondary | ICD-10-CM | POA: Diagnosis not present

## 2022-12-06 DIAGNOSIS — M5030 Other cervical disc degeneration, unspecified cervical region: Secondary | ICD-10-CM | POA: Diagnosis not present

## 2022-12-13 DIAGNOSIS — M25511 Pain in right shoulder: Secondary | ICD-10-CM | POA: Diagnosis not present

## 2022-12-15 DIAGNOSIS — M25511 Pain in right shoulder: Secondary | ICD-10-CM | POA: Diagnosis not present

## 2022-12-19 DIAGNOSIS — M25511 Pain in right shoulder: Secondary | ICD-10-CM | POA: Diagnosis not present

## 2022-12-27 DIAGNOSIS — M25511 Pain in right shoulder: Secondary | ICD-10-CM | POA: Diagnosis not present

## 2023-01-10 DIAGNOSIS — M25511 Pain in right shoulder: Secondary | ICD-10-CM | POA: Diagnosis not present

## 2023-01-12 ENCOUNTER — Telehealth: Payer: Self-pay | Admitting: *Deleted

## 2023-01-12 ENCOUNTER — Telehealth: Payer: Self-pay

## 2023-01-12 DIAGNOSIS — M25511 Pain in right shoulder: Secondary | ICD-10-CM | POA: Diagnosis not present

## 2023-01-12 NOTE — Telephone Encounter (Signed)
Patient scheduled for tele visit on 01/13/23. Med rec and consent done. Ok'd to use slot per preop provider.

## 2023-01-12 NOTE — Telephone Encounter (Signed)
   Name: [name]  DOB: Sep 14, 1940  MRN: [id number]  Primary Cardiologist: Peter Swaziland, MD   Preoperative team, please contact this patient and set up a phone call appointment for further preoperative risk assessment. Please obtain consent and complete medication review. Thank you for your help.  I confirm that guidance regarding antiplatelet and oral anticoagulation therapy has been completed and, if necessary, noted below.  Per office protocol, he may hold Plavix for 5-7 days prior to procedure and should resume as soon as hemodynamically stable postoperatively. Appropriate hold is 5 days, however a 7 day hold is permissible if patient has not experienced any new cardiac complaints.  I also confirmed the patient resides in the state of [place]. As per [facility] Medical Board telemedicine laws, the patient must reside in the state in which the provider is licensed.   Napoleon Form, Leodis Rains, NP 01/12/2023, 10:16 AM [facility]

## 2023-01-12 NOTE — Telephone Encounter (Signed)
   Pre-operative Risk Assessment    Patient Name: [name]  DOB: [birth year] MRN: [id number]      Request for Surgical Clearance    Procedure:   CERVICAL SELECTIVE NERVE ROOT BLOCK  Date of Surgery:  Clearance 01/24/23                                 Surgeon:  DR. Ethelene Hal Surgeon's Group or Practice Name:  [facility] Phone number:  [phone] Fax number:  [phone]   Type of Clearance Requested:   - Medical  - Pharmacy:  Hold Clopidogrel (Plavix) X'S 7 DAYS   Type of Anesthesia:  Not Indicated   Additional requests/questions:    Wilhemina Cash   01/12/2023, 9:15 AM

## 2023-01-12 NOTE — Telephone Encounter (Signed)
  Patient Consent for Virtual Visit         [name] has provided verbal consent on 01/12/2023 for a virtual visit (video or telephone).   CONSENT FOR VIRTUAL VISIT FOR:  [name]  By participating in this virtual visit I agree to the following:  I hereby voluntarily request, consent and authorize [facility] and its employed or contracted physicians, physician assistants, nurse practitioners or other licensed health care professionals (the Practitioner), to provide me with telemedicine health care services (the "Services") as deemed necessary by the treating Practitioner. I acknowledge and consent to receive the Services by the Practitioner via telemedicine. I understand that the telemedicine visit will involve communicating with the Practitioner through live audiovisual communication technology and the disclosure of certain medical information by electronic transmission. I acknowledge that I have been given the opportunity to request an in-person assessment or other available alternative prior to the telemedicine visit and am voluntarily participating in the telemedicine visit.  I understand that I have the right to withhold or withdraw my consent to the use of telemedicine in the course of my care at any time, without affecting my right to future care or treatment, and that the Practitioner or I may terminate the telemedicine visit at any time. I understand that I have the right to inspect all information obtained and/or recorded in the course of the telemedicine visit and may receive copies of available information for a reasonable Bean.  I understand that some of the potential risks of receiving the Services via telemedicine include:  Delay or interruption in medical evaluation due to technological equipment failure or disruption; Information transmitted may not be sufficient (e.g. poor resolution of images) to allow for appropriate medical decision making by the  Practitioner; and/or  In rare instances, security protocols could fail, causing a breach of personal health information.  Furthermore, I acknowledge that it is my responsibility to provide information about my medical history, conditions and care that is complete and accurate to the best of my ability. I acknowledge that Practitioner's advice, recommendations, and/or decision may be based on factors not within their control, such as incomplete or inaccurate data provided by me or distortions of diagnostic images or specimens that may result from electronic transmissions. I understand that the practice of medicine is not an exact science and that Practitioner makes no warranties or guarantees regarding treatment outcomes. I acknowledge that a copy of this consent can be made available to me via my patient portal [facility] MyChart), or I can request a printed copy by calling the office of [facility].    I understand that my insurance will be billed for this visit.   I have read or had this consent read to me. I understand the contents of this consent, which adequately explains the benefits and risks of the Services being provided via telemedicine.  I have been provided ample opportunity to ask questions regarding this consent and the Services and have had my questions answered to my satisfaction. I give my informed consent for the services to be provided through the use of telemedicine in my medical care

## 2023-01-13 ENCOUNTER — Ambulatory Visit: Payer: PPO | Attending: Nurse Practitioner

## 2023-01-13 DIAGNOSIS — Z01810 Encounter for preprocedural cardiovascular examination: Secondary | ICD-10-CM

## 2023-01-13 NOTE — Progress Notes (Signed)
Virtual Visit via Telephone Note   Because of [name]'s co-morbid illnesses, she is at least at moderate risk for complications without adequate follow up.  This format is felt to be most appropriate for this patient at this time.  The patient did not have access to video technology/had technical difficulties with video requiring transitioning to audio format only (telephone).  All issues noted in this document were discussed and addressed.  No physical exam could be performed with this format.  Please refer to the patient's chart for her consent to telehealth for [facility].  Evaluation Performed:  Preoperative cardiovascular risk assessment _____________   Date:  01/13/2023   Patient ID:  [name], DOB 1941/01/23, MRN [id number] Patient Location:  Home Provider location:   Office  Primary Care Provider:  Gaspar Garbe, MD Primary Cardiologist:  Peter Swaziland, MD  Chief Complaint / Patient Profile   82 y.o. y/o female with a h/o CAD s/p LHC 2008 with PCI/stent to LAD, HLD, HTN, DM type II, GERD who is pending cervical selective nerve root block and presents today for telephonic preoperative cardiovascular risk assessment.  History of Present Illness    [name] is a 82 y.o. female who presents via audio/video conferencing for a telehealth visit today.  Pt was last seen in cardiology clinic on 07/06/2022 by Edd Fabian, NP.  At that time JARETZI KAUT was doing well with no further episodes of palpitations losing 7 pounds since starting semaglutide.  She endorses no chest pain and blood pressure was at goal.  Creatinine clearance for left breast radioactive seed and lumpectomy. The patient is now pending procedure as outlined above. Since her last visit, she has been doing well with no new cardiac complaints.  She is recovering from rotator cuff surgery and is able to complete daily ADLs without any difficulties.  She denies chest pain,  shortness of breath, lower extremity edema, fatigue, palpitations, melena, hematuria, hemoptysis, diaphoresis, weakness, presyncope, syncope, orthopnea, and PND.    Past Medical History    Past Medical History:  Diagnosis Date   CAD (coronary artery disease)    Cataract    Chronic cough    Diabetes mellitus    Dysrhythmia    Endometriosis    GERD (gastroesophageal reflux disease)    History of heart artery stent    Hyperlipidemia    Hypertension    OA (osteoarthritis)    Obesity    Past Surgical History:  Procedure Laterality Date   ANGIOPLASTY     APPENDECTOMY     BREAST ENHANCEMENT SURGERY  age 48   BREAST LUMPECTOMY WITH RADIOACTIVE SEED LOCALIZATION Left 07/13/2022   Procedure: LEFT BREAST LUMPECTOMY WITH RADIOACTIVE SEED LOCALIZATION;  Surgeon: Manus Rudd, MD;  Location: [facility] OR;  Service: General;  Laterality: Left;   CARDIAC CATHETERIZATION  07/17/2006   CARDIOVASCULAR STRESS TEST  03/31/2009   EF 72%   CATARACT EXTRACTION  2018   CHOLECYSTECTOMY     heart stent     LEFT HEART CATHETERIZATION WITH CORONARY ANGIOGRAM N/A 10/09/2012   Procedure: LEFT HEART CATHETERIZATION WITH CORONARY ANGIOGRAM;  Surgeon: Peter M Swaziland, MD;  Location: [facility] CATH LAB;  Service: Cardiovascular;  Laterality: N/A;   TONSILLECTOMY     VAGINAL HYSTERECTOMY  1973    Allergies  Allergies  Allergen Reactions   Erythromycin Other (See Comments)    Mouth ulcers    Home Medications    Prior to Admission medications   Medication  Sig Start Date End Date Taking? Authorizing Provider  carvedilol (COREG) 12.5 MG tablet Take 1 tablet (12.5 mg total) by mouth 2 (two) times daily. 09/29/22 12/28/22  [place]il[place], Peter M, MD  clopidogrel (PLAVIX) 75 MG tablet Take 1 tablet (75 mg total) by mouth daily. 07/22/21   Alver Sorrow, NP  empagliflozin (JARDIANCE) 25 MG TABS tablet Take 25 mg by mouth daily.    [provider]  estradiol (VIVELLE-DOT) 0.05 MG/24HR patch Place 1 patch (0.05 mg total)  onto the skin 2 (two) times a week. 11/26/18   Fontaine, Nadyne Coombes, MD  ibuprofen (ADVIL) 200 MG tablet Take 800 mg by mouth See admin instructions. Take 800 mg in the morning, may take an additional 400-600 mg during the day as needed for pain    [provider]  Ibuprofen-diphenhydrAMINE HCl (ADVIL PM) 200-25 MG CAPS Take 1 tablet by mouth at bedtime.    [provider]  losartan (COZAAR) 25 MG tablet TAKE 1 TABLET BY MOUTH ONCE DAILY 09/27/22   [place]il[place], Peter M, MD  Magnesium 500 MG TABS Take 500 mg by mouth at bedtime.    [provider]  metFORMIN (GLUCOPHAGE) 500 MG tablet Take 500 mg by mouth 2 (two) times daily with a meal.    [provider]  Multiple Vitamin (MULTIVITAMIN WITH MINERALS) TABS tablet Take 1 tablet by mouth daily.    [provider]  omeprazole (PRILOSEC) 20 MG capsule Take 20 mg by mouth daily.      [provider]  [facility] VERIO test strip 1 strip by Other route daily. Use 1 strip to check glucose once a day 07/21/14   [provider]  Polyethyl Glycol-Propyl Glycol (LUBRICATING EYE DROPS OP) Place 1 drop into both eyes daily as needed (dry eyes).    [provider]  propranolol (INDERAL) 10 MG tablet Take 1 tablet (10 mg total) by mouth 4 (four) times daily as needed (Palpitations/SVT (heart rate >150)). 06/28/22   Elayne Snare K, DO  rosuvastatin (CRESTOR) 10 MG tablet Take 10 mg by mouth at bedtime.    [provider]  Semaglutide, 1 MG/DOSE, (OZEMPIC, 1 MG/DOSE,) 4 MG/3ML SOPN Inject 1 mg into the skin every Saturday.    [provider]  TOUJEO SOLOSTAR 300 UNIT/ML SOPN Inject 66 Units as directed at bedtime. 01/24/14   [provider]    Physical Exam    Vital Signs:  [name] does not have vital signs available for review today.  Given telephonic nature of communication, physical exam is limited. AAOx3. NAD. Normal affect.  Speech and respirations are  unlabored.  Accessory Clinical Findings    None  Assessment & Plan    1.  Preoperative Cardiovascular Risk Assessment: -Patient's RCRI score is 6.6% The patient affirms she has been doing well without any new cardiac symptoms. They are able to achieve 7 METS without cardiac limitations. Therefore, based on ACC/AHA guidelines, the patient would be at acceptable risk for the planned procedure without further cardiovascular testing. The patient was advised that if she develops new symptoms prior to surgery to contact our office to arrange for a follow-up visit, and she verbalized understanding.   The patient was advised that if she develops new symptoms prior to surgery to contact our office to arrange for a follow-up visit, and she verbalized understanding.  Per protocol patient can hold Plavix 7 days prior to procedure and should restart postprocedure once hemostasis is achieved  A copy  of this note will be routed to requesting surgeon.  Time:   Today, I have spent 6 minutes with the patient with telehealth technology discussing medical history, symptoms, and management plan.     Napoleon Form, Leodis Rains, NP  01/13/2023, 7:47 AM

## 2023-01-19 DIAGNOSIS — M25511 Pain in right shoulder: Secondary | ICD-10-CM | POA: Diagnosis not present

## 2023-01-23 NOTE — Progress Notes (Signed)
 [name] Perfect Date of Birth: 1940/10/10 Medical Record #[id number]  History of Present Illness: Ms. Furey is seen for follow up CAD.  She has known CAD with past Taxus stent to the LAD in 2008, HTN, HLD, IDDM and GERD. In September 2014 she had an abnormal Myoview  study. Cardiac cath showed nonobstructive CAD.  In October 2018 she had vascular screening showing mild nonobstructive carotid plaque. Abdominal US  showed no aneurysm. LE arterial dopplers were normal.   She was seen 04/07/2021 by Jackee Alberts, NP for palpitations. BMP, CBC, TSH were unremarkable with the exception of mild dehydration. Cardiac event monitor was ordered and showed predominant normal sinus rhythm 70 bpm, first-degree AV block, 2 runs of SVT, rare PVCs and PACs with a burden of less than 1%.   She underwent breast lumpectomy in June 2024.   She had right shoulder surgery in September. 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   On follow up today she is doing well from a cardiac standpoint.   She denies any chest pain or SOB.  No palpitations. Has lost 9 lbs this year.  Current Outpatient Medications  Medication Sig Dispense Refill   carvedilol  (COREG ) 12.5 MG tablet Take 1 tablet (12.5 mg total) by mouth 2 (two) times daily. 180 tablet 3   clopidogrel  (PLAVIX ) 75 MG tablet Take 1 tablet (75 mg total) by mouth daily. 90 tablet 3   empagliflozin  (JARDIANCE ) 25 MG TABS tablet Take 25 mg by mouth daily.     estradiol  (VIVELLE -DOT) 0.05 MG/24HR patch Place 1 patch (0.05 mg total) onto the skin 2 (two) times a week. 24 patch 4   ibuprofen (ADVIL) 200 MG tablet Take 800 mg by mouth See admin instructions. Take 800 mg in the morning, may take an additional 400-600 mg during the day as needed for pain     Ibuprofen-diphenhydrAMINE HCl (ADVIL PM) 200-25 MG CAPS Take 1 tablet by mouth at bedtime.     losartan  (COZAAR ) 25 MG tablet TAKE 1 TABLET BY MOUTH ONCE DAILY 90 tablet 2   Magnesium 500 MG  TABS Take 500 mg by mouth at bedtime.     metFORMIN (GLUCOPHAGE) 500 MG tablet Take 500 mg by mouth 2 (two) times daily with a meal.     Multiple Vitamin (MULTIVITAMIN WITH MINERALS) TABS tablet Take 1 tablet by mouth daily.     omeprazole (PRILOSEC) 20 MG capsule Take 20 mg by mouth daily.       ONETOUCH VERIO test strip 1 strip by Other route daily. Use 1 strip to check glucose once a day  0   Polyethyl Glycol-Propyl Glycol (LUBRICATING EYE DROPS OP) Place 1 drop into both eyes daily as needed (dry eyes).     propranolol  (INDERAL ) 10 MG tablet Take 1 tablet (10 mg total) by mouth 4 (four) times daily as needed (Palpitations/SVT (heart rate >150)). 30 tablet 0   rosuvastatin (CRESTOR) 10 MG tablet Take 10 mg by mouth at bedtime.     Semaglutide, 1 MG/DOSE, (OZEMPIC, 1 MG/DOSE,) 4 MG/3ML SOPN Inject 1 mg into the skin every Saturday.     TOUJEO  SOLOSTAR 300 UNIT/ML SOPN Inject 66 Units as directed at bedtime.  0   Current Facility-Administered Medications  Medication Dose Route Frequency Provider Last Rate Last Admin   0.9 %  sodium chloride  infusion  500 mL Intravenous Continuous Abran Norleen SAILOR, MD        Allergies  Allergen Reactions   Erythromycin Other (See  Comments)    Mouth ulcers    Past Medical History:  Diagnosis Date   CAD (coronary artery disease)    Cataract    Chronic cough    Diabetes mellitus    Dysrhythmia    Endometriosis    GERD (gastroesophageal reflux disease)    History of heart artery stent    Hyperlipidemia    Hypertension    OA (osteoarthritis)    Obesity     Past Surgical History:  Procedure Laterality Date   ANGIOPLASTY     APPENDECTOMY     BREAST ENHANCEMENT SURGERY  age 31   BREAST LUMPECTOMY WITH RADIOACTIVE SEED LOCALIZATION Left 07/13/2022   Procedure: LEFT BREAST LUMPECTOMY WITH RADIOACTIVE SEED LOCALIZATION;  Surgeon: Belinda Cough, MD;  Location: [facility] OR;  Service: General;  Laterality: Left;   CARDIAC CATHETERIZATION  07/17/2006    CARDIOVASCULAR STRESS TEST  03/31/2009   EF 72%   CATARACT EXTRACTION  2018   CHOLECYSTECTOMY     heart stent     LEFT HEART CATHETERIZATION WITH CORONARY ANGIOGRAM N/A 10/09/2012   Procedure: LEFT HEART CATHETERIZATION WITH CORONARY ANGIOGRAM;  Surgeon: Shayann Garbutt M Tayva Easterday, MD;  Location: [facility] CATH LAB;  Service: Cardiovascular;  Laterality: N/A;   TONSILLECTOMY     VAGINAL HYSTERECTOMY  1973    Social History   Tobacco Use  Smoking Status Never  Smokeless Tobacco Never    Social History   Substance and Sexual Activity  Alcohol  Use Yes   Alcohol /week: 0.0 standard drinks of alcohol    Comment: Rare    Family History  Problem Relation Age of Onset   Alcohol  abuse Mother    Diabetes Maternal Grandmother    Hypertension Maternal Grandmother    Lung cancer Father     Review of Systems: The review of systems is per the HPI.  All other systems were reviewed and are negative.  Physical Exam: BP (!) 102/50 (BP Location: Left Arm, Patient Position: Sitting, Cuff Size: Normal)   Pulse 84   Ht 5' 5.5 (1.664 m)   Wt 161 lb (73 kg)   BMI 26.38 kg/m  GENERAL:  Well appearing obese WF in NAD HEENT:  PERRL, EOMI, sclera are clear. Oropharynx is clear. NECK:  No jugular venous distention, carotid upstroke brisk and symmetric, no bruits, no thyromegaly or adenopathy LUNGS:  Clear to auscultation bilaterally CHEST:  Unremarkable HEART:  RRR,  PMI not displaced or sustained,S1 and S2 within normal limits, no S3, no S4: no clicks, no rubs, no murmurs ABD:  Soft, nontender. BS +, no masses or bruits. No hepatomegaly, no splenomegaly EXT:  2 + pulses throughout, no edema, no cyanosis no clubbing SKIN:  Warm and dry.  No rashes. Scaly plaques on LE.  NEURO:  Alert and oriented x 3. Cranial nerves II through XII intact. PSYCH:  Cognitively intact   LABORATORY DATA:  Dated 09/13/16: cholesterol 152, triglycerides 262, HDL 41, LDL 59. A1c 7.3%.  Dated 03/23/16: normal CMET Dated 10/03/17:  glucose 200. Otherwise normal CMET and CBC. Cholesterol 170, triglycerides 247, HDL 49, LDL 72, Apo B 101. A1c 7.4%.  Dated 10/02/18: A1c 7.6%. cholesterol 154. Triglycerides 285. HDL 44, LDL 53. CMET and CBC are normal. Dated 04/08/19: A1c 7.7%. CMET normal. Dated 10/02/19: cholesterol 152, triglycerides 250, HDL 47, LDL 55. CMET normal Dated 04/08/20: A1c 7.2% Dated 11/12/22: cholesterol 137, triglycerides 236, HDL 48, LDL 49. A1c 5.5%. CBC and CMET normal.    Echo 04/21/21: IMPRESSIONS     1. Left  ventricular ejection fraction, by estimation, is 60 to 65%. The  left ventricle has normal function. The left ventricle has no regional  wall motion abnormalities. There is mild left ventricular hypertrophy.  Left ventricular diastolic parameters  are consistent with Grade I diastolic dysfunction (impaired relaxation).   2. Right ventricular systolic function is normal. The right ventricular  size is normal. There is normal pulmonary artery systolic pressure.   3. The mitral valve is abnormal. Trivial mitral valve regurgitation.   4. The aortic valve is tricuspid. Aortic valve regurgitation is not  visualized.   5. The inferior vena cava is normal in size with greater than 50%  respiratory variability, suggesting right atrial pressure of 3 mmHg.   Comparison(s): No prior Echocardiogram.   Event monitor 04/29/21: Study Highlights  Patch Wear Time:  13 days and 23 hours   Patient had a min HR of 52 bpm, max HR of 132 bpm, and avg HR of 70 bpm. Predominant underlying rhythm was Sinus Rhythm. 2 Supraventricular Tachycardia runs occurred, the run with the fastest interval lasting 17 beats with a max rate of 132 bpm (avg 107 bpm); the run with the fastest interval was also the longest. No VT, atrial fibrillation, high degree block, or pauses noted. Isolated atrial and ventricular ectopy was rare (<1%). There were 0 triggered events. No high risk findings.   Assessment / Plan: 1. CAD with past stenting  of the LAD 2008 - Asymptomatic. Cardiac cath Sept. 2014 showed nonobstructive CAD. She is asymptomatic. Prior Myoview  not reliable so will not pursue further evaluation unless symptomatic. Continue medical Rx.   2. HTN - BP is well controlled.   3. HLD -  Continue Crestor. LDL well controlled- 49.  Elevated triglycerides with DM and obesity. Encouraged by  weight loss. Labs followed by PCP.   4. IDDM - per primary care. Last A1c 5.5%  I will follow up in one year.

## 2023-01-24 DIAGNOSIS — M5412 Radiculopathy, cervical region: Secondary | ICD-10-CM | POA: Diagnosis not present

## 2023-01-26 DIAGNOSIS — M25511 Pain in right shoulder: Secondary | ICD-10-CM | POA: Diagnosis not present

## 2023-01-30 ENCOUNTER — Ambulatory Visit: Payer: PPO | Attending: Cardiology | Admitting: Cardiology

## 2023-01-30 ENCOUNTER — Encounter: Payer: Self-pay | Admitting: Cardiology

## 2023-01-30 VITALS — BP 102/50 | HR 84 | Ht 65.5 in | Wt 161.0 lb

## 2023-01-30 DIAGNOSIS — I25118 Atherosclerotic heart disease of native coronary artery with other forms of angina pectoris: Secondary | ICD-10-CM

## 2023-01-30 DIAGNOSIS — E1165 Type 2 diabetes mellitus with hyperglycemia: Secondary | ICD-10-CM

## 2023-01-30 DIAGNOSIS — I10 Essential (primary) hypertension: Secondary | ICD-10-CM

## 2023-01-30 DIAGNOSIS — E782 Mixed hyperlipidemia: Secondary | ICD-10-CM | POA: Diagnosis not present

## 2023-01-30 NOTE — Patient Instructions (Signed)
 Medication Instructions:  Continue same medications *If you need a refill on your cardiac medications before your next appointment, please call your pharmacy*   Lab Work: None ordered   Testing/Procedures: None ordered   Follow-Up: At [facility], you and your health needs are our priority.  As part of our continuing mission to provide you with exceptional heart care, we have created designated Provider Care Teams.  These Care Teams include your primary Cardiologist (physician) and Advanced Practice Providers (APPs -  Physician Assistants and Nurse Practitioners) who all work together to provide you with the care you need, when you need it.  We recommend signing up for the patient portal called "MyChart".  Sign up information is provided on this After Visit Summary.  MyChart is used to connect with patients for Virtual Visits (Telemedicine).  Patients are able to view lab/test results, encounter notes, upcoming appointments, etc.  Non-urgent messages can be sent to your provider as well.   To learn more about what you can do with MyChart, go to [email or web address].    Your next appointment:  1 year   Call in Oct to schedule Jan appointment     Provider:  Dr.Jordan

## 2023-02-16 DIAGNOSIS — M25511 Pain in right shoulder: Secondary | ICD-10-CM | POA: Diagnosis not present

## 2023-02-21 DIAGNOSIS — E1165 Type 2 diabetes mellitus with hyperglycemia: Secondary | ICD-10-CM | POA: Diagnosis not present

## 2023-02-21 DIAGNOSIS — E7800 Pure hypercholesterolemia, unspecified: Secondary | ICD-10-CM | POA: Diagnosis not present

## 2023-02-21 DIAGNOSIS — Z794 Long term (current) use of insulin: Secondary | ICD-10-CM | POA: Diagnosis not present

## 2023-02-21 DIAGNOSIS — I10 Essential (primary) hypertension: Secondary | ICD-10-CM | POA: Diagnosis not present

## 2023-02-21 DIAGNOSIS — E1159 Type 2 diabetes mellitus with other circulatory complications: Secondary | ICD-10-CM | POA: Diagnosis not present

## 2023-02-21 DIAGNOSIS — F331 Major depressive disorder, recurrent, moderate: Secondary | ICD-10-CM | POA: Diagnosis not present

## 2023-02-21 DIAGNOSIS — E1151 Type 2 diabetes mellitus with diabetic peripheral angiopathy without gangrene: Secondary | ICD-10-CM | POA: Diagnosis not present

## 2023-02-21 DIAGNOSIS — M5412 Radiculopathy, cervical region: Secondary | ICD-10-CM | POA: Diagnosis not present

## 2023-02-21 DIAGNOSIS — I471 Supraventricular tachycardia, unspecified: Secondary | ICD-10-CM | POA: Diagnosis not present

## 2023-02-21 DIAGNOSIS — Z79890 Hormone replacement therapy (postmenopausal): Secondary | ICD-10-CM | POA: Diagnosis not present

## 2023-02-21 DIAGNOSIS — D692 Other nonthrombocytopenic purpura: Secondary | ICD-10-CM | POA: Diagnosis not present

## 2023-02-21 DIAGNOSIS — D242 Benign neoplasm of left breast: Secondary | ICD-10-CM | POA: Diagnosis not present

## 2023-02-23 DIAGNOSIS — M25511 Pain in right shoulder: Secondary | ICD-10-CM | POA: Diagnosis not present

## 2023-03-02 DIAGNOSIS — Z4789 Encounter for other orthopedic aftercare: Secondary | ICD-10-CM | POA: Diagnosis not present

## 2023-03-13 ENCOUNTER — Other Ambulatory Visit: Payer: Self-pay | Admitting: Cardiology

## 2023-03-13 ENCOUNTER — Telehealth: Payer: Self-pay | Admitting: Cardiology

## 2023-03-13 ENCOUNTER — Ambulatory Visit: Payer: PPO | Attending: Cardiology

## 2023-03-13 DIAGNOSIS — R000 Tachycardia, unspecified: Secondary | ICD-10-CM

## 2023-03-13 DIAGNOSIS — I499 Cardiac arrhythmia, unspecified: Secondary | ICD-10-CM

## 2023-03-13 DIAGNOSIS — R42 Dizziness and giddiness: Secondary | ICD-10-CM

## 2023-03-13 NOTE — Telephone Encounter (Signed)
 STAT if HR is under 50 or over 120 (normal HR is 60-100 beats per minute)  What is your heart rate? 70 today, Friday? Saturday HR 170's  Do you have a log of your heart rate readings (document readings)? no  Do you have any other symptoms? Dizziness, chest tightness

## 2023-03-13 NOTE — Progress Notes (Unsigned)
 Enrolled for [employer] to mail a ZIO XT long term holter monitor to the patients address on file.

## 2023-03-13 NOTE — Telephone Encounter (Signed)
 Patient identification verified by 2 forms. Marilynn Rail, RN    Called and spoke to patient  Patient states:   -Friday evening develops high heart rate in the 170s   -took one dose of 10mg  propranolol   -takes propranolol as needed   -heart rate started to decrease Saturday   -by Sunday heart rate decrease to the 90s   -today heart rate is in the 70s  -during episode of tachycardia she felt dizzy and had a bad headache, chest heaviness  -apple watch alerted her she was in Afib   -apple watch states she is currently in normal rhythm   -she does not have a history of Afib   -unsure how accurate apple watch is accurate   -takes Plavix and carvedilol Informed patient message sent to take [place] for input/advisement  Reviewed ED warning signs/precautions  Patient verbalized understanding, no questions at this time

## 2023-03-13 NOTE — Telephone Encounter (Signed)
 Spoke to patient she stated she does not know how to send readings.Advised she will receive heart monitor to wear 2 weeks.Instructions are included.Advised to call number on box if she has any questions.

## 2023-03-26 DIAGNOSIS — R000 Tachycardia, unspecified: Secondary | ICD-10-CM | POA: Diagnosis not present

## 2023-03-26 DIAGNOSIS — I499 Cardiac arrhythmia, unspecified: Secondary | ICD-10-CM

## 2023-03-26 DIAGNOSIS — R42 Dizziness and giddiness: Secondary | ICD-10-CM

## 2023-04-04 DIAGNOSIS — M545 Low back pain, unspecified: Secondary | ICD-10-CM | POA: Diagnosis not present

## 2023-04-04 DIAGNOSIS — M48062 Spinal stenosis, lumbar region with neurogenic claudication: Secondary | ICD-10-CM | POA: Diagnosis not present

## 2023-04-04 DIAGNOSIS — M542 Cervicalgia: Secondary | ICD-10-CM | POA: Diagnosis not present

## 2023-04-04 DIAGNOSIS — M5030 Other cervical disc degeneration, unspecified cervical region: Secondary | ICD-10-CM | POA: Diagnosis not present

## 2023-04-14 DIAGNOSIS — R42 Dizziness and giddiness: Secondary | ICD-10-CM | POA: Diagnosis not present

## 2023-04-14 DIAGNOSIS — I499 Cardiac arrhythmia, unspecified: Secondary | ICD-10-CM | POA: Diagnosis not present

## 2023-04-28 DIAGNOSIS — E7800 Pure hypercholesterolemia, unspecified: Secondary | ICD-10-CM | POA: Diagnosis not present

## 2023-04-28 DIAGNOSIS — I471 Supraventricular tachycardia, unspecified: Secondary | ICD-10-CM | POA: Diagnosis not present

## 2023-04-28 DIAGNOSIS — D242 Benign neoplasm of left breast: Secondary | ICD-10-CM | POA: Diagnosis not present

## 2023-04-28 DIAGNOSIS — E1151 Type 2 diabetes mellitus with diabetic peripheral angiopathy without gangrene: Secondary | ICD-10-CM | POA: Diagnosis not present

## 2023-04-28 DIAGNOSIS — F331 Major depressive disorder, recurrent, moderate: Secondary | ICD-10-CM | POA: Diagnosis not present

## 2023-04-28 DIAGNOSIS — E1165 Type 2 diabetes mellitus with hyperglycemia: Secondary | ICD-10-CM | POA: Diagnosis not present

## 2023-04-28 DIAGNOSIS — I10 Essential (primary) hypertension: Secondary | ICD-10-CM | POA: Diagnosis not present

## 2023-04-28 DIAGNOSIS — I2510 Atherosclerotic heart disease of native coronary artery without angina pectoris: Secondary | ICD-10-CM | POA: Diagnosis not present

## 2023-04-28 DIAGNOSIS — Z794 Long term (current) use of insulin: Secondary | ICD-10-CM | POA: Diagnosis not present

## 2023-04-28 DIAGNOSIS — E663 Overweight: Secondary | ICD-10-CM | POA: Diagnosis not present

## 2023-04-28 DIAGNOSIS — E1159 Type 2 diabetes mellitus with other circulatory complications: Secondary | ICD-10-CM | POA: Diagnosis not present

## 2023-04-28 DIAGNOSIS — Z79890 Hormone replacement therapy (postmenopausal): Secondary | ICD-10-CM | POA: Diagnosis not present

## 2023-05-11 IMAGING — DX DG CHEST 1V PORT
1 series · 1 of 1 positions shown · non-contrast
Comparison: October 08, 2012

CLINICAL DATA: Nausea, vomiting and diarrhea.

EXAM:
PORTABLE CHEST 1 VIEW

[chest ap]
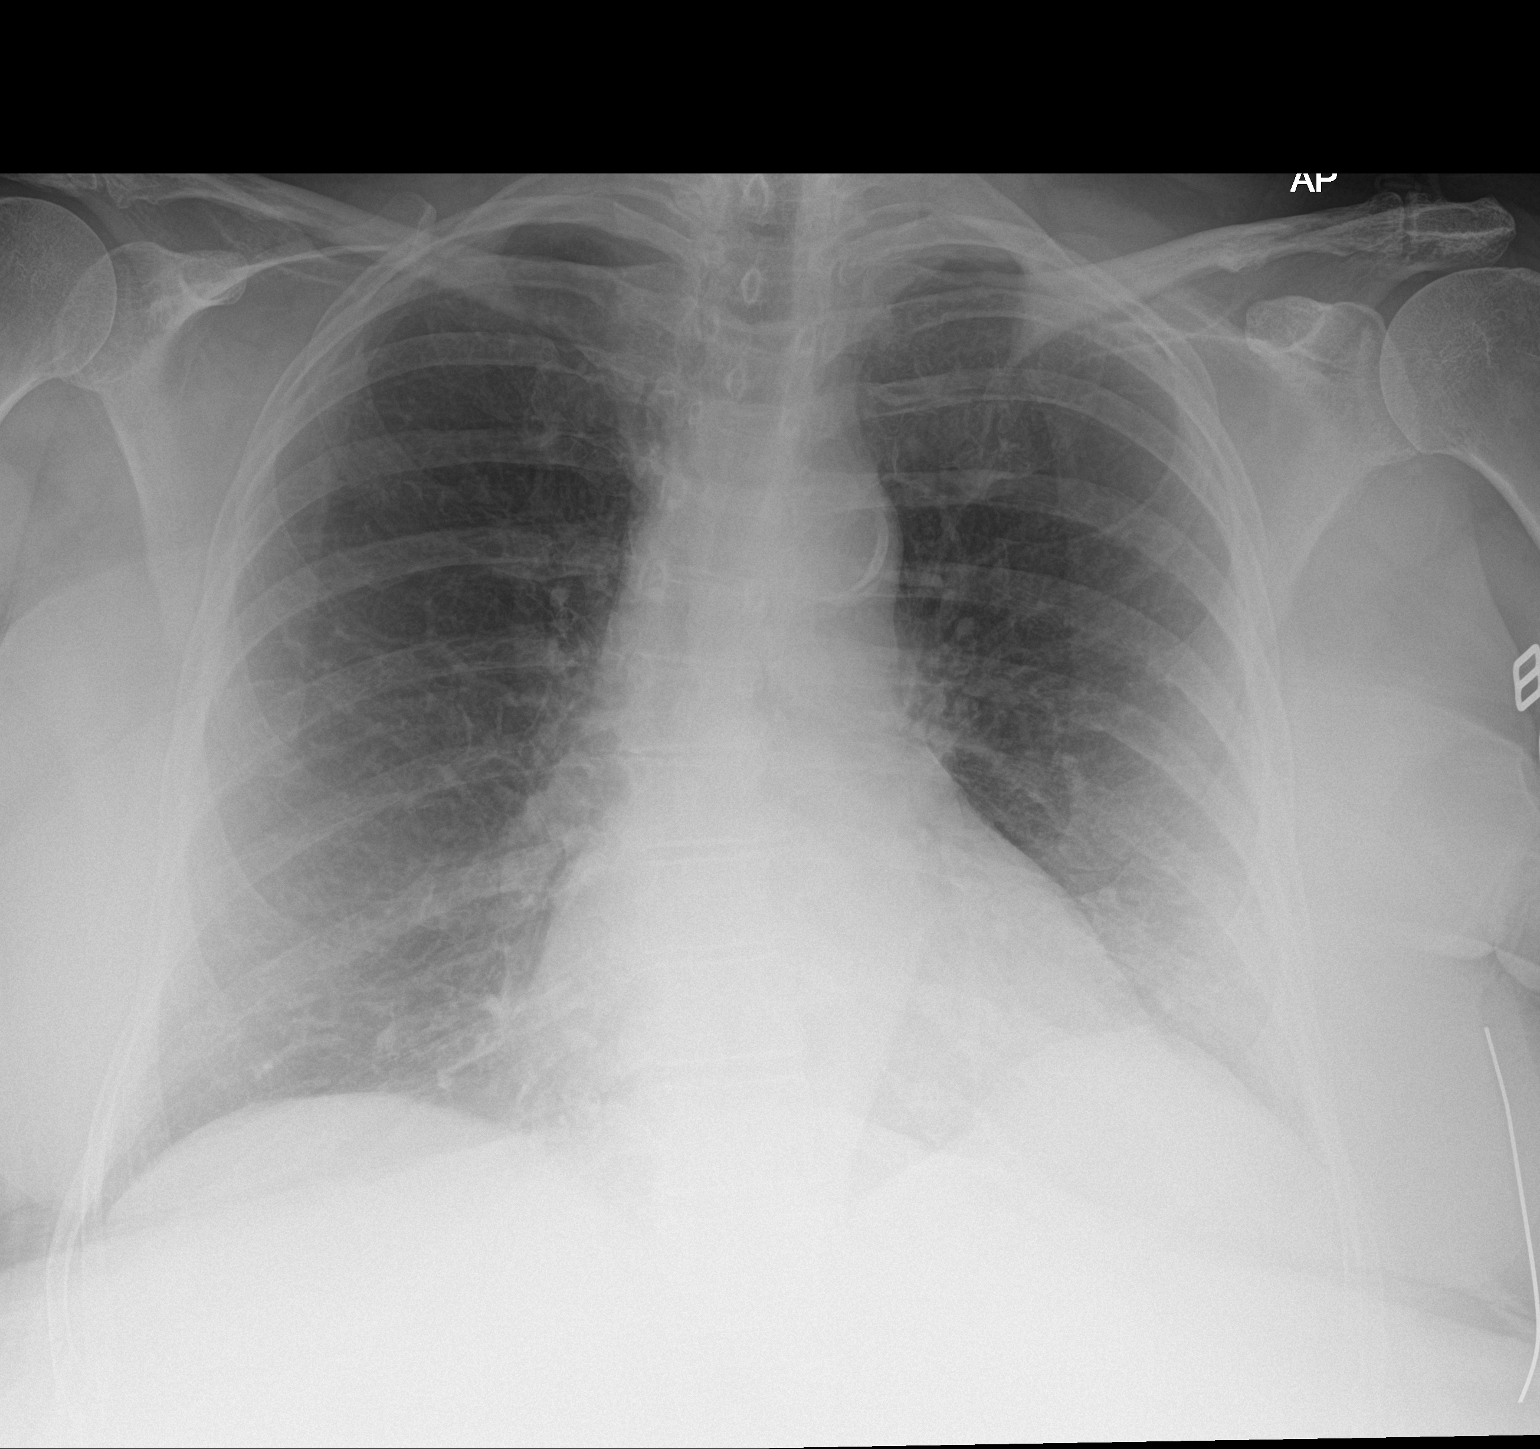

[1 of 1 positions shown; findings below may reference images not displayed]

FINDINGS: A trace amount of atelectasis is seen within the bilateral lung
bases. There is no evidence of acute infiltrate, pleural effusion or
pneumothorax. The heart size and mediastinal contours are within
normal limits. There is marked severity calcification of the aortic
arch. Mild dextroscoliosis of the midthoracic spine is noted.
IMPRESSION: No acute cardiopulmonary disease.

## 2023-06-18 ENCOUNTER — Other Ambulatory Visit: Payer: Self-pay | Admitting: Cardiology

## 2023-07-18 DIAGNOSIS — D1801 Hemangioma of skin and subcutaneous tissue: Secondary | ICD-10-CM | POA: Diagnosis not present

## 2023-07-18 DIAGNOSIS — L814 Other melanin hyperpigmentation: Secondary | ICD-10-CM | POA: Diagnosis not present

## 2023-07-18 DIAGNOSIS — L821 Other seborrheic keratosis: Secondary | ICD-10-CM | POA: Diagnosis not present

## 2023-07-18 DIAGNOSIS — L820 Inflamed seborrheic keratosis: Secondary | ICD-10-CM | POA: Diagnosis not present

## 2023-07-18 DIAGNOSIS — D485 Neoplasm of uncertain behavior of skin: Secondary | ICD-10-CM | POA: Diagnosis not present

## 2023-07-18 DIAGNOSIS — B078 Other viral warts: Secondary | ICD-10-CM | POA: Diagnosis not present

## 2023-08-18 DIAGNOSIS — I10 Essential (primary) hypertension: Secondary | ICD-10-CM | POA: Diagnosis not present

## 2023-08-18 DIAGNOSIS — E1151 Type 2 diabetes mellitus with diabetic peripheral angiopathy without gangrene: Secondary | ICD-10-CM | POA: Diagnosis not present

## 2023-08-18 DIAGNOSIS — R232 Flushing: Secondary | ICD-10-CM | POA: Diagnosis not present

## 2023-08-18 DIAGNOSIS — F331 Major depressive disorder, recurrent, moderate: Secondary | ICD-10-CM | POA: Diagnosis not present

## 2023-08-18 DIAGNOSIS — Z79890 Hormone replacement therapy (postmenopausal): Secondary | ICD-10-CM | POA: Diagnosis not present

## 2023-08-18 DIAGNOSIS — E663 Overweight: Secondary | ICD-10-CM | POA: Diagnosis not present

## 2023-08-18 DIAGNOSIS — I471 Supraventricular tachycardia, unspecified: Secondary | ICD-10-CM | POA: Diagnosis not present

## 2023-08-18 DIAGNOSIS — I2510 Atherosclerotic heart disease of native coronary artery without angina pectoris: Secondary | ICD-10-CM | POA: Diagnosis not present

## 2023-08-18 DIAGNOSIS — E7800 Pure hypercholesterolemia, unspecified: Secondary | ICD-10-CM | POA: Diagnosis not present

## 2023-08-18 DIAGNOSIS — D242 Benign neoplasm of left breast: Secondary | ICD-10-CM | POA: Diagnosis not present

## 2023-08-18 DIAGNOSIS — Z794 Long term (current) use of insulin: Secondary | ICD-10-CM | POA: Diagnosis not present

## 2023-08-18 DIAGNOSIS — E1159 Type 2 diabetes mellitus with other circulatory complications: Secondary | ICD-10-CM | POA: Diagnosis not present

## 2023-09-15 ENCOUNTER — Other Ambulatory Visit: Payer: Self-pay | Admitting: Cardiology

## 2023-09-15 DIAGNOSIS — I499 Cardiac arrhythmia, unspecified: Secondary | ICD-10-CM

## 2023-10-05 DIAGNOSIS — M5412 Radiculopathy, cervical region: Secondary | ICD-10-CM | POA: Diagnosis not present

## 2023-10-05 DIAGNOSIS — M542 Cervicalgia: Secondary | ICD-10-CM | POA: Diagnosis not present

## 2023-10-05 DIAGNOSIS — Z5181 Encounter for therapeutic drug level monitoring: Secondary | ICD-10-CM | POA: Diagnosis not present

## 2023-11-22 DIAGNOSIS — E1165 Type 2 diabetes mellitus with hyperglycemia: Secondary | ICD-10-CM | POA: Diagnosis not present

## 2023-11-22 DIAGNOSIS — E7800 Pure hypercholesterolemia, unspecified: Secondary | ICD-10-CM | POA: Diagnosis not present

## 2023-11-22 DIAGNOSIS — E7849 Other hyperlipidemia: Secondary | ICD-10-CM | POA: Diagnosis not present

## 2023-11-22 DIAGNOSIS — E1151 Type 2 diabetes mellitus with diabetic peripheral angiopathy without gangrene: Secondary | ICD-10-CM | POA: Diagnosis not present

## 2023-11-22 DIAGNOSIS — Z1389 Encounter for screening for other disorder: Secondary | ICD-10-CM | POA: Diagnosis not present

## 2023-11-22 DIAGNOSIS — I10 Essential (primary) hypertension: Secondary | ICD-10-CM | POA: Diagnosis not present

## 2023-11-23 DIAGNOSIS — H353132 Nonexudative age-related macular degeneration, bilateral, intermediate dry stage: Secondary | ICD-10-CM | POA: Diagnosis not present

## 2023-11-23 DIAGNOSIS — E113291 Type 2 diabetes mellitus with mild nonproliferative diabetic retinopathy without macular edema, right eye: Secondary | ICD-10-CM | POA: Diagnosis not present

## 2023-11-23 DIAGNOSIS — H43813 Vitreous degeneration, bilateral: Secondary | ICD-10-CM | POA: Diagnosis not present

## 2023-11-23 DIAGNOSIS — H04123 Dry eye syndrome of bilateral lacrimal glands: Secondary | ICD-10-CM | POA: Diagnosis not present

## 2023-11-23 DIAGNOSIS — H0011 Chalazion right upper eyelid: Secondary | ICD-10-CM | POA: Diagnosis not present

## 2023-11-23 DIAGNOSIS — H52203 Unspecified astigmatism, bilateral: Secondary | ICD-10-CM | POA: Diagnosis not present

## 2023-12-04 DIAGNOSIS — Z1339 Encounter for screening examination for other mental health and behavioral disorders: Secondary | ICD-10-CM | POA: Diagnosis not present

## 2023-12-04 DIAGNOSIS — Z1331 Encounter for screening for depression: Secondary | ICD-10-CM | POA: Diagnosis not present

## 2023-12-04 DIAGNOSIS — Z79890 Hormone replacement therapy (postmenopausal): Secondary | ICD-10-CM | POA: Diagnosis not present

## 2023-12-04 DIAGNOSIS — E1151 Type 2 diabetes mellitus with diabetic peripheral angiopathy without gangrene: Secondary | ICD-10-CM | POA: Diagnosis not present

## 2023-12-04 DIAGNOSIS — E7800 Pure hypercholesterolemia, unspecified: Secondary | ICD-10-CM | POA: Diagnosis not present

## 2023-12-04 DIAGNOSIS — F331 Major depressive disorder, recurrent, moderate: Secondary | ICD-10-CM | POA: Diagnosis not present

## 2023-12-04 DIAGNOSIS — Z794 Long term (current) use of insulin: Secondary | ICD-10-CM | POA: Diagnosis not present

## 2023-12-04 DIAGNOSIS — M47892 Other spondylosis, cervical region: Secondary | ICD-10-CM | POA: Diagnosis not present

## 2023-12-04 DIAGNOSIS — E1159 Type 2 diabetes mellitus with other circulatory complications: Secondary | ICD-10-CM | POA: Diagnosis not present

## 2023-12-04 DIAGNOSIS — Z0000 Encounter for general adult medical examination without abnormal findings: Secondary | ICD-10-CM | POA: Diagnosis not present

## 2023-12-04 DIAGNOSIS — I10 Essential (primary) hypertension: Secondary | ICD-10-CM | POA: Diagnosis not present

## 2023-12-04 DIAGNOSIS — I471 Supraventricular tachycardia, unspecified: Secondary | ICD-10-CM | POA: Diagnosis not present

## 2024-02-06 NOTE — Progress Notes (Unsigned)
 "  [name] Perfect Date of Birth: 03-15-1940 Medical Record #[id number]  History of Present Illness: [name] is seen for follow up CAD.  She has known CAD with past Taxus stent to the LAD in 2008, HTN, HLD, IDDM and GERD. In September 2014 she had an abnormal Myoview  study. Cardiac cath showed nonobstructive CAD.  In October 2018 she had vascular screening showing mild nonobstructive carotid plaque. Abdominal US  showed no aneurysm. LE arterial dopplers were normal.   She was seen 04/07/2021 by Janet Alberts, NP for palpitations. BMP, CBC, TSH were unremarkable with the exception of mild dehydration. Cardiac event monitor was ordered and showed predominant normal sinus rhythm 70 bpm, first-degree AV block, 2 runs of SVT, rare PVCs and PACs with a burden of less than 1%.   She underwent breast lumpectomy in June 2024.   She had right shoulder surgery in September. 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   On follow up today she is doing well from a cardiac standpoint.   She denies any chest pain or SOB.  No palpitations. Has lost 9 lbs this year.  Current Outpatient Medications  Medication Sig Dispense Refill   carvedilol  (COREG ) 12.5 MG tablet TAKE 1 TABLET BY MOUTH 2 TIMES DAILY. 180 tablet 3   clopidogrel  (PLAVIX ) 75 MG tablet Take 1 tablet (75 mg total) by mouth daily. 90 tablet 3   empagliflozin  (JARDIANCE ) 25 MG TABS tablet Take 25 mg by mouth daily.     estradiol  (VIVELLE -DOT) 0.05 MG/24HR patch Place 1 patch (0.05 mg total) onto the skin 2 (two) times a week. 24 patch 4   ibuprofen (ADVIL) 200 MG tablet Take 800 mg by mouth See admin instructions. Take 800 mg in the morning, may take an additional 400-600 mg during the day as needed for pain     Ibuprofen-diphenhydrAMINE HCl (ADVIL PM) 200-25 MG CAPS Take 1 tablet by mouth at bedtime.     losartan  (COZAAR ) 25 MG tablet TAKE 1 TABLET BY MOUTH ONCE DAILY 90 tablet 2   Magnesium 500 MG TABS Take 500 mg by  mouth at bedtime.     metFORMIN (GLUCOPHAGE) 500 MG tablet Take 500 mg by mouth 2 (two) times daily with a meal.     Multiple Vitamin (MULTIVITAMIN WITH MINERALS) TABS tablet Take 1 tablet by mouth daily.     omeprazole (PRILOSEC) 20 MG capsule Take 20 mg by mouth daily.       ONETOUCH VERIO test strip 1 strip by Other route daily. Use 1 strip to check glucose once a day  0   Polyethyl Glycol-Propyl Glycol (LUBRICATING EYE DROPS OP) Place 1 drop into both eyes daily as needed (dry eyes).     propranolol  (INDERAL ) 10 MG tablet Take 1 tablet (10 mg total) by mouth 4 (four) times daily as needed (Palpitations/SVT (heart rate >150)). 30 tablet 0   rosuvastatin (CRESTOR) 10 MG tablet Take 10 mg by mouth at bedtime.     Semaglutide, 1 MG/DOSE, (OZEMPIC, 1 MG/DOSE,) 4 MG/3ML SOPN Inject 1 mg into the skin every Saturday.     TOUJEO  SOLOSTAR 300 UNIT/ML SOPN Inject 66 Units as directed at bedtime.  0   Current Facility-Administered Medications  Medication Dose Route Frequency Provider Last Rate Last Admin   0.9 %  sodium chloride  infusion  500 mL Intravenous Continuous Abran Norleen SAILOR, MD        Allergies  Allergen Reactions   Erythromycin Other (See Comments)  Mouth ulcers    Past Medical History:  Diagnosis Date   CAD (coronary artery disease)    Cataract    Chronic cough    Diabetes mellitus    Dysrhythmia    Endometriosis    GERD (gastroesophageal reflux disease)    History of heart artery stent    Hyperlipidemia    Hypertension    OA (osteoarthritis)    Obesity     Past Surgical History:  Procedure Laterality Date   ANGIOPLASTY     APPENDECTOMY     BREAST ENHANCEMENT SURGERY  age 84   BREAST LUMPECTOMY WITH RADIOACTIVE SEED LOCALIZATION Left 07/13/2022   Procedure: LEFT BREAST LUMPECTOMY WITH RADIOACTIVE SEED LOCALIZATION;  Surgeon: Belinda Cough, MD;  Location: [facility] OR;  Service: General;  Laterality: Left;   CARDIAC CATHETERIZATION  07/17/2006   CARDIOVASCULAR STRESS TEST   03/31/2009   EF 72%   CATARACT EXTRACTION  2018   CHOLECYSTECTOMY     heart stent     LEFT HEART CATHETERIZATION WITH CORONARY ANGIOGRAM N/A 10/09/2012   Procedure: LEFT HEART CATHETERIZATION WITH CORONARY ANGIOGRAM;  Surgeon: Delayni Streed M Cyann Venti, MD;  Location: [facility] CATH LAB;  Service: Cardiovascular;  Laterality: N/A;   TONSILLECTOMY     VAGINAL HYSTERECTOMY  1973    Social History   Tobacco Use  Smoking Status Never  Smokeless Tobacco Never    Social History   Substance and Sexual Activity  Alcohol  Use Yes   Alcohol /week: 0.0 standard drinks of alcohol    Comment: Rare    Family History  Problem Relation Age of Onset   Alcohol  abuse Mother    Diabetes Maternal Grandmother    Hypertension Maternal Grandmother    Lung cancer Father     Review of Systems: The review of systems is per the HPI.  All other systems were reviewed and are negative.  Physical Exam: There were no vitals taken for this visit. GENERAL:  Well appearing obese WF in NAD HEENT:  PERRL, EOMI, sclera are clear. Oropharynx is clear. NECK:  No jugular venous distention, carotid upstroke brisk and symmetric, no bruits, no thyromegaly or adenopathy LUNGS:  Clear to auscultation bilaterally CHEST:  Unremarkable HEART:  RRR,  PMI not displaced or sustained,S1 and S2 within normal limits, no S3, no S4: no clicks, no rubs, no murmurs ABD:  Soft, nontender. BS +, no masses or bruits. No hepatomegaly, no splenomegaly EXT:  2 + pulses throughout, no edema, no cyanosis no clubbing SKIN:  Warm and dry.  No rashes. Scaly plaques on LE.  NEURO:  Alert and oriented x 3. Cranial nerves II through XII intact. PSYCH:  Cognitively intact   LABORATORY DATA:  Dated 09/13/16: cholesterol 152, triglycerides 262, HDL 41, LDL 59. A1c 7.3%.  Dated 03/23/16: normal CMET Dated 10/03/17: glucose 200. Otherwise normal CMET and CBC. Cholesterol 170, triglycerides 247, HDL 49, LDL 72, Apo B 101. A1c 7.4%.  Dated 10/02/18: A1c 7.6%.  cholesterol 154. Triglycerides 285. HDL 44, LDL 53. CMET and CBC are normal. Dated 04/08/19: A1c 7.7%. CMET normal. Dated 10/02/19: cholesterol 152, triglycerides 250, HDL 47, LDL 55. CMET normal Dated 04/08/20: A1c 7.2% Dated 11/12/22: cholesterol 137, triglycerides 236, HDL 48, LDL 49. A1c 5.5%. CBC and CMET normal.  Dated 11/22/23: A1c 5.3%. CMET, CBC, TSH normal. Cholesterol 141, triglycerides 182,  HDL 56, LDL 49   EKG Interpretation Date/Time:  Friday February 09 2024 16:12:36 EST Ventricular Rate:  62 PR Interval:  290 QRS Duration:  92 QT Interval:  402 QTC Calculation: 408 R Axis:   -30  Text Interpretation: Sinus rhythm with 1st degree A-V block Left axis deviation When compared with ECG of 27-Jun-2022 18:44, No significant change was found Confirmed by Makenize Messman [phone]) on 02/09/2024 4:40:55 PM     Echo 04/21/21: IMPRESSIONS     1. Left ventricular ejection fraction, by estimation, is 60 to 65%. The  left ventricle has normal function. The left ventricle has no regional  wall motion abnormalities. There is mild left ventricular hypertrophy.  Left ventricular diastolic parameters  are consistent with Grade I diastolic dysfunction (impaired relaxation).   2. Right ventricular systolic function is normal. The right ventricular  size is normal. There is normal pulmonary artery systolic pressure.   3. The mitral valve is abnormal. Trivial mitral valve regurgitation.   4. The aortic valve is tricuspid. Aortic valve regurgitation is not  visualized.   5. The inferior vena cava is normal in size with greater than 50%  respiratory variability, suggesting right atrial pressure of 3 mmHg.   Comparison(s): No prior Echocardiogram.   Event monitor 04/29/21: Study Highlights  Patch Wear Time:  13 days and 23 hours   Patient had a min HR of 52 bpm, max HR of 132 bpm, and avg HR of 70 bpm. Predominant underlying rhythm was Sinus Rhythm. 2 Supraventricular Tachycardia runs occurred,  the run with the fastest interval lasting 17 beats with a max rate of 132 bpm (avg 107 bpm); the run with the fastest interval was also the longest. No VT, atrial fibrillation, high degree block, or pauses noted. Isolated atrial and ventricular ectopy was rare (<1%). There were 0 triggered events. No high risk findings.   Assessment / Plan: 1. CAD with past stenting of the LAD 2008 - Asymptomatic. Cardiac cath Sept. 2014 showed nonobstructive CAD. She is asymptomatic. Prior Myoview  not reliable so will not pursue further evaluation unless symptomatic. Continue medical Rx.   2. HTN - BP is well controlled.   3. HLD -  Continue Crestor. LDL well controlled- 49.    4. IDDM - per primary care. Last A1c 5.3%  I will follow up in one year.    "

## 2024-02-09 ENCOUNTER — Encounter: Payer: Self-pay | Admitting: Cardiology

## 2024-02-09 ENCOUNTER — Ambulatory Visit: Attending: Cardiology | Admitting: Cardiology

## 2024-02-09 VITALS — BP 124/74 | HR 62 | Ht 65.05 in | Wt 151.2 lb

## 2024-02-09 DIAGNOSIS — I499 Cardiac arrhythmia, unspecified: Secondary | ICD-10-CM | POA: Diagnosis not present

## 2024-02-09 DIAGNOSIS — I10 Essential (primary) hypertension: Secondary | ICD-10-CM | POA: Diagnosis not present

## 2024-02-09 DIAGNOSIS — E782 Mixed hyperlipidemia: Secondary | ICD-10-CM

## 2024-02-09 DIAGNOSIS — R000 Tachycardia, unspecified: Secondary | ICD-10-CM | POA: Diagnosis not present

## 2024-02-09 DIAGNOSIS — R42 Dizziness and giddiness: Secondary | ICD-10-CM | POA: Diagnosis not present

## 2024-02-09 DIAGNOSIS — I25118 Atherosclerotic heart disease of native coronary artery with other forms of angina pectoris: Secondary | ICD-10-CM

## 2024-02-09 NOTE — Patient Instructions (Addendum)
